# Patient Record
Sex: Female | Born: 1940 | Race: White | Hispanic: No | Marital: Married | Smoking: Former smoker
Health system: Southern US, Community
[De-identification: ages and names within clinical notes are randomized; demographics above are authoritative.]

## PROBLEM LIST (undated history)

## (undated) DIAGNOSIS — E785 Hyperlipidemia, unspecified: Secondary | ICD-10-CM

## (undated) DIAGNOSIS — I10 Essential (primary) hypertension: Secondary | ICD-10-CM

## (undated) DIAGNOSIS — M1990 Unspecified osteoarthritis, unspecified site: Secondary | ICD-10-CM

## (undated) DIAGNOSIS — C801 Malignant (primary) neoplasm, unspecified: Secondary | ICD-10-CM

## (undated) DIAGNOSIS — E119 Type 2 diabetes mellitus without complications: Secondary | ICD-10-CM

## (undated) HISTORY — DX: Type 2 diabetes mellitus without complications: E11.9

## (undated) HISTORY — PX: TOTAL HIP ARTHROPLASTY: SHX124

## (undated) HISTORY — DX: Unspecified osteoarthritis, unspecified site: M19.90

## (undated) HISTORY — DX: Hyperlipidemia, unspecified: E78.5

## (undated) HISTORY — DX: Malignant (primary) neoplasm, unspecified: C80.1

## (undated) HISTORY — DX: Essential (primary) hypertension: I10

---

## 2003-06-12 ENCOUNTER — Other Ambulatory Visit: Payer: Self-pay

## 2004-10-04 ENCOUNTER — Ambulatory Visit: Payer: Self-pay | Admitting: Unknown Physician Specialty

## 2006-09-19 ENCOUNTER — Ambulatory Visit: Payer: Self-pay | Admitting: Unknown Physician Specialty

## 2009-04-02 ENCOUNTER — Ambulatory Visit: Payer: Self-pay | Admitting: Gastroenterology

## 2009-04-07 ENCOUNTER — Ambulatory Visit: Payer: Self-pay | Admitting: Gastroenterology

## 2009-12-23 ENCOUNTER — Ambulatory Visit: Payer: Self-pay | Admitting: Unknown Physician Specialty

## 2011-10-10 DIAGNOSIS — M25559 Pain in unspecified hip: Secondary | ICD-10-CM | POA: Insufficient documentation

## 2011-10-10 DIAGNOSIS — M722 Plantar fascial fibromatosis: Secondary | ICD-10-CM | POA: Insufficient documentation

## 2012-05-01 LAB — COMPREHENSIVE METABOLIC PANEL
Albumin: 4 g/dL (ref 3.4–5.0)
Alkaline Phosphatase: 80 U/L (ref 50–136)
BUN: 10 mg/dL (ref 7–18)
Calcium, Total: 9.3 mg/dL (ref 8.5–10.1)
Chloride: 101 mmol/L (ref 98–107)
Co2: 23 mmol/L (ref 21–32)
Creatinine: 0.74 mg/dL (ref 0.60–1.30)
EGFR (Non-African Amer.): >60
Glucose: 118 mg/dL — ABNORMAL HIGH (ref 65–99)
SGOT(AST): 30 U/L (ref 15–37)
SGPT (ALT): 27 U/L (ref 12–78)
Sodium: 135 mmol/L — ABNORMAL LOW (ref 136–145)

## 2012-05-01 LAB — CBC
HGB: 14.9 g/dL (ref 12.0–16.0)
MCHC: 33.7 g/dL (ref 32.0–36.0)
RBC: 4.72 10*6/uL (ref 3.80–5.20)

## 2012-05-01 LAB — PROTIME-INR
INR: 1
Prothrombin Time: 13.5 secs (ref 11.5–14.7)

## 2012-05-02 ENCOUNTER — Inpatient Hospital Stay: Payer: Self-pay | Admitting: Internal Medicine

## 2012-05-02 LAB — BASIC METABOLIC PANEL
Anion Gap: 9 (ref 7–16)
BUN: 11 mg/dL (ref 7–18)
Calcium, Total: 8.6 mg/dL (ref 8.5–10.1)
Chloride: 99 mmol/L (ref 98–107)
Co2: 27 mmol/L (ref 21–32)
Creatinine: 0.77 mg/dL (ref 0.60–1.30)
EGFR (African American): >60
EGFR (Non-African Amer.): >60
Glucose: 115 mg/dL — ABNORMAL HIGH (ref 65–99)
Osmolality: 270 (ref 275–301)
Potassium: 3.8 mmol/L (ref 3.5–5.1)
Sodium: 135 mmol/L — ABNORMAL LOW (ref 136–145)

## 2012-05-02 LAB — CBC WITH DIFFERENTIAL/PLATELET
Basophil %: 0.8 %
Eosinophil #: 0.3 10*3/uL (ref 0.0–0.7)
Eosinophil %: 3.9 %
HCT: 40.5 % (ref 35.0–47.0)
Lymphocyte %: 20.1 %
MCH: 31.9 pg (ref 26.0–34.0)
MCHC: 34 g/dL (ref 32.0–36.0)
MCV: 94 fL (ref 80–100)
Monocyte %: 8.5 %
Neutrophil #: 5.2 10*3/uL (ref 1.4–6.5)
Neutrophil %: 66.7 %
Platelet: 225 10*3/uL (ref 150–440)
RBC: 4.31 10*6/uL (ref 3.80–5.20)
RDW: 13.3 % (ref 11.5–14.5)
WBC: 7.7 10*3/uL (ref 3.6–11.0)

## 2012-05-02 LAB — LIPID PANEL
HDL Cholesterol: 45 mg/dL (ref 40–60)
Ldl Cholesterol, Calc: 63 mg/dL (ref 0–100)
Triglycerides: 112 mg/dL (ref 0–200)
VLDL Cholesterol, Calc: 22 mg/dL (ref 5–40)

## 2012-05-02 LAB — MAGNESIUM: Magnesium: 2 mg/dL

## 2012-05-02 LAB — CK TOTAL AND CKMB (NOT AT ARMC)
CK, Total: 69 U/L (ref 21–215)
CK-MB: 1.1 ng/mL (ref 0.5–3.6)

## 2012-05-02 LAB — HEMOGLOBIN A1C: Hemoglobin A1C: 6.3 % (ref 4.2–6.3)

## 2012-05-02 LAB — TROPONIN I: Troponin-I: <0.02 ng/mL

## 2012-05-06 LAB — CREATININE, SERUM
Creatinine: 0.85 mg/dL (ref 0.60–1.30)
EGFR (African American): >60

## 2012-05-09 ENCOUNTER — Ambulatory Visit: Payer: Self-pay | Admitting: Gastroenterology

## 2012-05-31 ENCOUNTER — Ambulatory Visit: Payer: Self-pay | Admitting: Gastroenterology

## 2012-06-03 LAB — PATHOLOGY REPORT

## 2013-03-20 DIAGNOSIS — M1610 Unilateral primary osteoarthritis, unspecified hip: Secondary | ICD-10-CM | POA: Insufficient documentation

## 2013-04-28 ENCOUNTER — Ambulatory Visit: Payer: Self-pay | Admitting: Surgery

## 2013-04-28 LAB — CBC WITH DIFFERENTIAL/PLATELET
Basophil #: 0 10*3/uL (ref 0.0–0.1)
Basophil %: 0.6 %
Eosinophil #: 0.1 10*3/uL (ref 0.0–0.7)
Eosinophil %: 1.4 %
HCT: 43.3 % (ref 35.0–47.0)
HGB: 14.9 g/dL (ref 12.0–16.0)
Lymphocyte #: 1.4 10*3/uL (ref 1.0–3.6)
Lymphocyte %: 18.5 %
MCH: 32.2 pg (ref 26.0–34.0)
MCHC: 34.3 g/dL (ref 32.0–36.0)
MCV: 94 fL (ref 80–100)
MONO ABS: 0.8 x10 3/mm (ref 0.2–0.9)
Monocyte %: 10.7 %
NEUTROS PCT: 68.8 %
Neutrophil #: 5.2 10*3/uL (ref 1.4–6.5)
Platelet: 243 10*3/uL (ref 150–440)
RBC: 4.61 10*6/uL (ref 3.80–5.20)
RDW: 13 % (ref 11.5–14.5)
WBC: 7.6 10*3/uL (ref 3.6–11.0)

## 2013-05-05 ENCOUNTER — Ambulatory Visit: Payer: Self-pay | Admitting: Surgery

## 2013-05-19 DIAGNOSIS — IMO0002 Reserved for concepts with insufficient information to code with codable children: Secondary | ICD-10-CM | POA: Insufficient documentation

## 2013-06-16 DIAGNOSIS — Z96649 Presence of unspecified artificial hip joint: Secondary | ICD-10-CM | POA: Insufficient documentation

## 2013-06-30 ENCOUNTER — Ambulatory Visit: Payer: Self-pay | Admitting: Physician Assistant

## 2013-06-30 LAB — CBC WITH DIFFERENTIAL/PLATELET
Basophil #: 0.1 10*3/uL (ref 0.0–0.1)
Basophil %: 0.8 %
EOS ABS: 0.2 10*3/uL (ref 0.0–0.7)
Eosinophil %: 1.1 %
HCT: 42.9 % (ref 35.0–47.0)
HGB: 13.9 g/dL (ref 12.0–16.0)
Lymphocyte #: 1.6 10*3/uL (ref 1.0–3.6)
Lymphocyte %: 9 %
MCH: 30 pg (ref 26.0–34.0)
MCHC: 32.4 g/dL (ref 32.0–36.0)
MCV: 93 fL (ref 80–100)
Monocyte #: 1.1 x10 3/mm — ABNORMAL HIGH (ref 0.2–0.9)
Monocyte %: 6.5 %
Neutrophil #: 14.5 10*3/uL — ABNORMAL HIGH (ref 1.4–6.5)
Neutrophil %: 82.6 %
PLATELETS: 347 10*3/uL (ref 150–440)
RBC: 4.63 10*6/uL (ref 3.80–5.20)
RDW: 13.2 % (ref 11.5–14.5)
WBC: 17.6 10*3/uL — ABNORMAL HIGH (ref 3.6–11.0)

## 2013-06-30 LAB — BASIC METABOLIC PANEL
Anion Gap: 9 (ref 7–16)
BUN: 13 mg/dL (ref 7–18)
CALCIUM: 10.1 mg/dL (ref 8.5–10.1)
CREATININE: 0.86 mg/dL (ref 0.60–1.30)
Chloride: 94 mmol/L — ABNORMAL LOW (ref 98–107)
Co2: 31 mmol/L (ref 21–32)
EGFR (African American): >60
EGFR (Non-African Amer.): >60
Glucose: 111 mg/dL — ABNORMAL HIGH (ref 65–99)
OSMOLALITY: 269 (ref 275–301)
Potassium: 3.5 mmol/L (ref 3.5–5.1)
Sodium: 134 mmol/L — ABNORMAL LOW (ref 136–145)

## 2013-06-30 LAB — URINALYSIS, COMPLETE
Bacteria: NEGATIVE
Bilirubin,UR: NEGATIVE
Blood: NEGATIVE
GLUCOSE, UR: NEGATIVE mg/dL (ref 0–75)
Ketone: NEGATIVE
NITRITE: NEGATIVE
PH: 6 (ref 4.5–8.0)
Specific Gravity: 1.025 (ref 1.003–1.030)

## 2013-07-04 DIAGNOSIS — M7060 Trochanteric bursitis, unspecified hip: Secondary | ICD-10-CM | POA: Insufficient documentation

## 2014-04-30 ENCOUNTER — Ambulatory Visit (INDEPENDENT_AMBULATORY_CARE_PROVIDER_SITE_OTHER): Payer: Medicare Other | Admitting: Podiatry

## 2014-04-30 ENCOUNTER — Encounter: Payer: Self-pay | Admitting: Podiatry

## 2014-04-30 ENCOUNTER — Ambulatory Visit (INDEPENDENT_AMBULATORY_CARE_PROVIDER_SITE_OTHER): Payer: Medicare Other

## 2014-04-30 ENCOUNTER — Other Ambulatory Visit: Payer: Self-pay | Admitting: *Deleted

## 2014-04-30 ENCOUNTER — Encounter: Payer: Self-pay | Admitting: *Deleted

## 2014-04-30 VITALS — Ht 59.0 in | Wt 183.0 lb

## 2014-04-30 DIAGNOSIS — E119 Type 2 diabetes mellitus without complications: Secondary | ICD-10-CM

## 2014-04-30 DIAGNOSIS — L84 Corns and callosities: Secondary | ICD-10-CM

## 2014-04-30 DIAGNOSIS — M2042 Other hammer toe(s) (acquired), left foot: Secondary | ICD-10-CM | POA: Diagnosis not present

## 2014-04-30 DIAGNOSIS — K5730 Diverticulosis of large intestine without perforation or abscess without bleeding: Secondary | ICD-10-CM | POA: Insufficient documentation

## 2014-04-30 DIAGNOSIS — E785 Hyperlipidemia, unspecified: Secondary | ICD-10-CM | POA: Insufficient documentation

## 2014-04-30 DIAGNOSIS — I10 Essential (primary) hypertension: Secondary | ICD-10-CM | POA: Insufficient documentation

## 2014-04-30 DIAGNOSIS — C4491 Basal cell carcinoma of skin, unspecified: Secondary | ICD-10-CM | POA: Insufficient documentation

## 2014-04-30 NOTE — Patient Instructions (Signed)
Apply antibiotic ointment and a band-aid to the left 5th toe until healed.  Monitor for any signs/symptoms of infection. Call the office immediately if any occur or go directly to the emergency room. Call with any questions/concerns.

## 2014-04-30 NOTE — Progress Notes (Signed)
   Subjective:    Patient ID: Mallory Walters, female    DOB: 26-Jun-1940, 74 y.o.   MRN: [id number]  HPI Comments: 74 year old female presents the office they with a painful lesion on the left fifth toe between the fourth and fifth toe. She states this been ongoing for approximately one month and is painful particularly with pressure. She has previously seen her primary care physician for this. She's had no prior treatment. She denies any redness around the area or drainage. She is diabetic and her last HbA1c was 6.9. No other complaints at this time.  Toe Pain       Review of Systems  Endocrine:       Diabetes  Musculoskeletal:       Joint pain   All other systems reviewed and are negative.      Objective:   Physical Exam AAO x3, NAD DP/PT pulses palpable b/l, CRT < 3 sec Protective sensation intact with SWMF, vibratory sensation intact, Achilles tendon reflex intact.  There is a hyperkerotic lesion along the medial aspect of the left fifth digit at the level of the DIPJ. Upon debridement of the lesion, there is a small, pinpoint superficial opening. There is no purulence expressed. There is no surrounding erythema, ascending cellulitis, fluctuance, crepitance, or other clinical signs of infection. There is mild adductovarus of the 5th toe abutting the 4th digit. No other areas of tenderness to bilateral lower extremities. No overlying edema, erythema, increase in warmth bilaterally. MMT 5/5, ROM WNL.  No other open lesions or pre-ulcerative lesions. No pain with calf compression, swelling, warmth, erythema.      Assessment & Plan:  74 year old female with corn left 5th digit due to pressure -X-rays were obtained and reviewed with the patient. -Treatment options discussed including all alternatives, risks, complications -Lesion sharply debrided without complication to reveal underlying small, superficial wound without signs of infection. Antibiotic ointment was applied followed by a  band-aid. Continue this daily until healed. Monitor for any signs/symptoms of infection and directed to call the office immediately if any are to occur.  -Offloading pads also dispensed. -Follow-up as needed. Follow up in 10 days if the area on the left 5th toe is not healed, or if it remains symptomatic. In the meantime, encouraged to call the office with any questions/concerns/change in symptoms.

## 2014-06-26 NOTE — Consult Note (Signed)
PATIENT NAME:  Mallory Walters, Mallory Walters MR#:  [id number] DATE OF BIRTH:  02/13/1941  DATE OF CONSULTATION:  05/02/2012  REFERRING PHYSICIAN:   CONSULTING PHYSICIAN:  Lollie Sails, MD  REASON FOR CONSULTATION:  Intractable nausea, vomiting, chest pain.   HISTORY OF PRESENT ILLNESS:  The patient is a 74 year old Caucasian female who was admitted yesterday after coming to the hospital with chest pain. She states that about 5 days ago she began to have some upper central back pain. Then yesterday, this changed to a retrosternal pain that radiated under her left arm. It did not radiate into the arm or up the neck. Currently, she states it is retrosternal discomfort, 5 out of 10. It seems to increase if she lies flat. It is better if she is at an angle or standing. She does get nauseated however when she stands. She does have a history of having been seen by ENT in the past with a diagnosis of reflux-related pharyngitis. However, she denies any heartburn. She states she occasionally chokes on food, but does not have actual dysphagia. She does not regurgitate food. There is no nausea, vomiting or abdominal pain currently. She states she has a bowel movement about 3 to 4 times a day, no black stools, blood in the stools, or slimy stools. She did have a cholecystectomy many years ago. She does however report a bitter taste in her mouth when she awakens. This could infer bile/alkaline reflux. She has a history of a colonoscopy being done on 04/07/2009 for pelvic and rectal pain showing diverticulosis and internal hemorrhoids. She has never had an upper scope done.   PAST MEDICAL HISTORY:  The patient has a history of hypertension, hyperlipidemia and basal cell cancer.   PAST SURGICAL HISTORY:  Hysterectomy, appendectomy, tonsillectomy, Mohs surgery and cataract surgery as well as a cholecystectomy as noted.   GI FAMILY HISTORY:  Negative for colorectal cancer, liver disease, or ulcers.   SOCIAL HISTORY:  The  patient is a nonsmoker having quit many years ago. She does not use alcohol or any drugs.   REVIEW OF SYSTEMS:  10 systems reviewed per admission history and physical, agree with same.   CURRENT MEDICATIONS:  Aspirin 81 mg once a day, hydrochlorothiazide/triamterene 25/37.5 mg once a day, Metformin 500 mg once a day, simvastatin 20 mg once a day.   ALLERGIES:  CONTRAST DYE, OXYCODONE AND PENICILLIN.   PHYSICAL EXAMINATION:  VITAL SIGNS: Temperature 98.8, pulse 65, respirations 18, blood pressure 99/60, pulse oximetry 94%.  GENERAL: She is a 75 year old Caucasian female in no acute distress.  HEENT: Normocephalic, atraumatic. Eyes: Anicteric. Nose: Septum midline. No lesions. Oropharynx: No lesions.  NECK: Supple. No JVD. No lymphadenopathy. No thyromegaly.  HEART: Regular rate and rhythm.  LUNGS: Clear.  ABDOMEN: Soft. There is some minimal discomfort to palpation in the epigastric region. Bowel sounds positive, normoactive. There is no apparent organomegaly or masses felt.  RECTAL: Anorectal exam is deferred.  EXTREMITIES: No clubbing, cyanosis or edema.  NEUROLOGICAL: Cranial nerves II through XII grossly intact. Muscle strength bilaterally equal and symmetric, 5 out of 5. DTRs bilaterally equal and symmetric.   DIAGNOSTIC DATA:  On admission to the hospital, she had glucose 118, BUN 10, sodium 135, potassium 3.5, chloride 101 and bicarbonate 23. Her hepatic profile was normal. She has had cardiac enzymes x 3 normal. Hemogram on admission showed a white count of 10.0, hemoglobin of 14.9, hematocrit of 42.2, platelet count 276. Repeat laboratories today show a white count of  7.7, H and H 13.8/40.5, platelet count 225, normal indices. Her INR on admission to the hospital was 1.0. She had been on heparin. Her sodium is still 135 this morning. She has had multiple tests to include a CT scan of the chest, abdomen and pelvis without contrast. The chest film showed minimal subsegmental atelectasis at  the lung bases possibly some amount of emphysema, but no focal pneumonia. CT scan of abdomen and pelvis shows the gallbladder to be surgically absent. There is sigmoid diverticulosis. No evidence of diverticulitis. No evidence of bowel obstruction or ileus. No acute hepatobiliary abnormality or urinary tract abnormality. She had a portable chest film that showed no acute cardiopulmonary disorder. She had a Lexiscan showing normal infusion and EKG without evidence of ischemia or arrhythmia. Her LV systolic function showed an EF of 82% and no evidence of myocardial ischemia with normal myocardial perfusion. She had an abdominal ultrasound showing no evidence of DVT in either lower extremity.   ASSESSMENT:  Atypical chest pain. The patient does have a history of cholecystectomy, remote. She does report having a bitter type of taste in her mouth in the morning and this could infer the possibility of bile reflux issues. This could as well as give some atypical chest pain as well as several others in the differential diagnosis to include esophagitis or gastritis/gastric ulcers. It is of note that there is a V/Q scan that has been discussed. I will need to discuss further the possibility of this in light of negative Dopplers of the lower extremities and normal chest scan with the prime doc. I do feel that she will need an upper endoscopy prior to her discharge. We will discuss timing with prime doc in light of above. I have discussed the risks, benefits and complications of EGD to include, but not limited to bleeding, infection, perforation, and the risk of sedation and she wishes to proceed. In the interim, I would continue current medications to include intravenous pantoprazole 40 mg twice a day. We will tentatively place her on the schedule for tomorrow afternoon for EGD.    ____________________________ Lollie Sails, MD mus:si D: 05/02/2012 20:05:50 ET T: 05/02/2012 20:29:18 ET JOB#: [id number]  cc: Lollie Sails, MD, <Dictator> Lollie Sails MD ELECTRONICALLY SIGNED 05/05/2012 10:40

## 2014-06-26 NOTE — Consult Note (Signed)
Brief Consult Note: Diagnosis: Atypical chest pain, negative myoview.   Patient was seen by consultant.   Consult note dictated.   Recommend to proceed with surgery or procedure.   Recommend further assessment or treatment.   Orders entered.   Comments: Please see full Gi consult.  Patient presenting with atypical chest pain, evaluation uninformative at this time.  Question of possible need to do VQ scan.  DDX includes multiple GI etiologies to include bile/alkaline reflux.  Of note patien currently on lovenox and full dose ASA, inferring VQ should be done before considering sedated proceedure, particularly in that biopsies may be contraindicated.   Will tenatively place on endo schedule for egd tomorrow pm, but I will need to discuss above with Prime Doc first.  Continue ppi as you are..  Electronic Signatures: Loistine Simas (MD)  (Signed 27-Feb-14 20:12)  Authored: Brief Consult Note   Last Updated: 27-Feb-14 20:12 by Loistine Simas (MD)

## 2014-06-26 NOTE — Discharge Summary (Signed)
DATE OF BIRTH:  21-May-1940  DATE OF ADMISSION:  05/02/2012  ADMITTING PHYSICIAN:  Dr. Bridgette Habermann  DATE OF DISCHARGE:  05/06/2012  DISCHARGING PHYSICIAN:  Dr. Gladstone Lighter  CONSULTATIONS IN THE HOSPITAL:   1.  Cardiology consultation by Dr. Saralyn Pilar 2.  Gastroenterology consultation by Dr. Gustavo Lah.    PRIMARY CARE PHYSICIAN:  [facility]   DISCHARGE DIAGNOSES: 1.  Chest pain secondary to gastroesophageal reflux disease/bile reflux..  2.  Hypertension.  3.  Diabetes mellitus.   DISCHARGE HOME MEDICATIONS:   1.  Simvastatin 20 mg p.o. daily.  2.  Metformin 500 mg p.o. daily.  3.  Triamterene HCTZ 37.5/25 mg p.o. daily.  4. Sucralfate 1 gram per 10 mL oral suspension, 1 gram oral t.i.d. before meals.  5.  Pantoprazole 40 mg p.o. b.i.d.   DISCHARGE DIET:  Low-sodium diet.   DISCHARGE ACTIVITY:  As tolerated.    FOLLOWUP INSTRUCTIONS: 1.  Patient advised to hold her aspirin starting the day of discharge until she gets her EGD done, which will be this week.  2.  The patient was advised that Dr. Marton Redwood office will call her in the next day to schedule an appointment for her EGD this week. PCP followup in 2 weeks.    LABS AND IMAGING STUDIES:  WBC 7.7, hemoglobin 13.8, hematocrit 40.5, platelet count 225. Sodium 135, potassium 3.8, chloride 99, bicarb 27, BUN 11, creatinine 0.77, glucose of 115. Magnesium 2.0. HgA1c is 6.3. LDL cholesterol 63, HDL 45, triglycerides 112, and total cholesterol 130.   Echocardiogram showing normal LV systolic function, EF is 57% to 55%, normal cardiac valves,  mild mitral regurgitation is present. No evidence of ischemic, primary valvular, hypertrophic or pulmonary heart disease. Myoview showing normal Lexiscan, no evidence of ischemia or arrhythmia, normal LV systolic function, EF calculated to be 82%, and normal myocardial perfusion without evidence of myocardial ischemia.   D-dimer was slightly elevated at 0.58.   Ultrasound Dopplers  of bilateral lower extremities showing no evidence of any DVT.   V/Q scan was normal.    CT of the chest, abdomen and pelvis done without contrast showing sigmoid diverticulosis. No evidence of acute diverticulitis. No bowel obstruction, ileus or acute hepatobiliary or urinary tract abnormality. In the chest, there was no pleural/pericardial effusion. Minimal subsegmental atelectasis in the lung bases. No focal pneumonia seen.  BRIEF HOSPITAL COURSE: The patient is a very pleasant, 74 year old Caucasian female, with past medical history significant for hypertension and diabetes, comes to the hospital secondary to chest pain.   1.  Chest pain. Initially admitted for possible angina. She was monitored on telemetry. Cardiac enzymes remained negative. She did have a Lexiscan done, which was normal, and echo did not show any acute wall motion abnormalities. Her chest pain was more pleuritic in nature, midsternal, radiating to her back, and especially worsened when she lay flat in bed. It sounded more like reflux symptoms rather than pulmonary she did have a D-dimer done, which was slightly elevated. The patient had anaphylaxis to IV contrast, so she did have a plain CT scan, which did not show any pulmonary parenchymal abnormality, and V/Q scan done was negative for any PE. GI was consulted. She was started on Protonix and also Sucralfate.  Once V/Q was cleared, patient was supposed to get an EGD prior to discharge, but her symptoms have improved a lot, and since she was on full dose aspirin in the hospital, after discussing with the patient and Dr. Gustavo Lah, patient elected to  have the EGD as an outpatient. She is being discharged on a proton pump inhibitor and also Carafate at this time, and Dr. Marton Redwood office will call the patient to schedule for her endoscopy.   2.  Her home medications for hypertension and diabetes were continued without any changes. Her course has been otherwise uneventful in the  hospital.   DISCHARGE CONDITION: Stable.   DISCHARGE DISPOSITION: Home.   Time spent on discharge is 45 minutes.     ____________________________ Gladstone Lighter, MD rk:mr D: 05/07/2012 17:51:56 ET T: 05/07/2012 20:57:57 ET JOB#: [id number]  cc: Gladstone Lighter, MD, <Dictator> Lollie Sails, MD [place] MD ELECTRONICALLY SIGNED 05/08/2012 13:33

## 2014-06-26 NOTE — Consult Note (Signed)
Chief Complaint:  Subjective/Chief Complaint patient seen for atypical chest pain.  feeling some better today, had been eating some regular diet without problems. no emesis or nausea, no abdominal pain, still some retrosternal discomfort, 2-5/10.   VITAL SIGNS/ANCILLARY NOTES: **Vital Signs.:   28-Feb-14 16:20  Vital Signs Type Routine  Temperature Temperature (F) 98.6  Celsius 37  Temperature Source oral  Pulse Pulse 53  Respirations Respirations 18  Systolic BP Systolic BP 076  Diastolic BP (mmHg) Diastolic BP (mmHg) 71  Mean BP 84  Pulse Ox % Pulse Ox % 96  Pulse Ox Activity Level  At rest  Oxygen Delivery Room Air/ 21 %   Brief Assessment:  Cardiac Regular   Respiratory clear BS   Gastrointestinal details normal Soft  Nontender  Nondistended  No masses palpable  Bowel sounds normal   Assessment/Plan:  Assessment/Plan:  Assessment 1) atypical chest pain-stable/improving.  awaiting VQ scan before proceeding with EGD.   Plan 1) continue ppi and carafate. will arrange for egd monday, awaiting VQ scan tomorrow.  discussed with Dr Tressia Miners.   Electronic Signatures: Loistine Simas (MD)  (Signed [phone] 20:59)  Authored: Chief Complaint, VITAL SIGNS/ANCILLARY NOTES, Brief Assessment, Assessment/Plan   Last Updated: 28-Feb-14 20:59 by Loistine Simas (MD)

## 2014-06-26 NOTE — H&P (Signed)
PATIENT NAME:  Mallory Walters, Mallory Walters MR#:  [id number] DATE OF BIRTH:  August 29, 1940  DATE OF ADMISSION:  05/01/2012  REFERRING PHYSICIAN: [facility], referring physician Dr. Corky Downs.    CHIEF COMPLAINT:  Chest pain.    HISTORY OF PRESENT ILLNESS:  The patient is a pleasant 74 year old Caucasian female with diabetes, hypertension, hyperlipidemia who presents here with chest pain. She states that she had acute onset chest pain starting about 7:30 this morning. It was constant with radiation to her upper chest and under the left arm. The pain initially was 6 to 7 out of 10. She went to see her PCP. An EKG was done that showed no significant ST changes but was changed from previous EKGs in the sense that it had some T wave inversions in V2. It lasted about 30 to 45 minutes. She was given aspirin and was referred here. She was given nitroglycerin en route. The nitroglycerin appears to be helping the chest pain. Currently, the chest pain is better and not as intense but persistent. She has got a negative troponin. Hospitalist services were contacted for further evaluation and management. The patient also, of note, complains of a mild cough. She, of note, also had back pain in the left lateral side about 4 or 5 days ago.   PAST MEDICAL HISTORY: Hypertension, diabetes, history of diverticulosis, hyperlipidemia, basal cell cancer.   PAST SURGICAL HISTORY: Cholecystectomy, hysterectomy, appendectomy, tonsillectomy, MOHS surgery and cataract extraction.    ALLERGIES: CODEINE CAUSING GI UPSET, PENICILLIN G CAUSING HIVES AND IODINATED CONTRAST, I.E.  IV DYE CAUSING SHORTNESS OF BREATH, POSSIBLY ANAPHYLACTIC.   FAMILY HISTORY: Father with hypertension and CAD. Mother with hypertension and osteoporosis.   SOCIAL HISTORY: She is married. Former smoker, quit in 1970s. No alcohol or drug use.   OUTPATIENT MEDICATIONS: Aspirin 81 mg daily, hydrochlorothiazide/triamterene 25/37.5 mg 1 tab daily, metformin 500 mg in the  morning and simvastatin 20 mg daily.   REVIEW OF SYSTEMS:  CONSTITUTIONAL: No fever, fatigue or weakness. No weight changes.  EYES: Denies blurry vision, double vision or headaches.  ENT: No tinnitus, snoring, postnasal drip.  RESPIRATORY: Positive for shortness of breath associated with chest pain. Occasional cough, no hemoptysis, apnea or COPD  CARDIOVASCULAR: Chest pain as above. Some orthopnea today. No swelling in the legs. Has high blood pressure. No history of syncope or MI.  GASTROINTESTINAL: No nausea, vomiting, diarrhea, abdominal pain, melena or ulcers.  GENITOURINARY:  Denies dysuria or hematuria.  HEME/LYMPH:  Denies anemia or easy bruising.  SKIN: Denies any rashes.  MUSCULOSKELETAL: Left posterior pain in the back for several days.  NEUROLOGIC: Denies focal weakness, numbness or strokes.  PSYCH: Denies anxiety or insomnia.   PHYSICAL EXAMINATION: VITAL SIGNS: Temperature on arrival 96.8, pulse 88, respiratory rate 20, blood pressure 166/81, O2 sat 95% on room air.  GENERAL: The patient is an obese an elderly female laying in bed in no obvious distress.  HEENT: Normocephalic, atraumatic. Pupils are equal and reactive. Anicteric sclerae. Extraocular muscles intact. Moist mucous membranes.    NECK: Supple. No thyroid tenderness. No JVD. No cervical lymphadenopathy.  CARDIOVASCULAR: S1, S2, regular rate and rhythm. No significant murmurs appreciated.  LUNGS: Clear to auscultation without wheezing, rhonchi or rales.  ABDOMEN: Soft, nontender, nondistended. Positive bowel sounds in all quadrants.  EXTREMITIES: No significant lower extremity edema. No significant point tenderness to  auscultation except to palpation of the back.  NEUROLOGIC: Cranial nerves II through XII grossly intact. Strength is 5 out of 5 in  all extremities. Sensation is intact to light touch.  PSYCH: Awake, alert, oriented x 3. Cooperative.   LABORATORY DATA: Sodium 135, potassium 3.5, BUN 10, creatinine  0.74, glucose 118. LFTs within normal limits. Troponin negative. WBC 10, hemoglobin 14.9 and platelets 276. D-dimer 0.34. INR 1.   X-RAY OF THE CHEST: No acute cardiopulmonary disease. No pneumonia or infiltrates.   EKG: Normal sinus rhythm lobe. Low voltage QRS. There are no acute ST elevations or depressions. There are nonspecific ST abnormalities.   ASSESSMENT AND PLAN: We have a pleasant 74 year old Caucasian female with hypertension, hyperlipidemia and diabetes, who presents with chest pain with atypical features concerning for unstable angina. She has had no pains like this prior. There is a negative troponin and no significant ST elevations or depressions on the EKG at this point, but given patient with multiple risk factors we will admit the patient for observation and rule out acute coronary syndrome. The patient has been given a aspirin already. Will start it from tomorrow, continue the statin and add a beta blocker. I would trend troponins and CK-MB and obtain an echocardiogram, and admit the patient on telemetry bed. Would also order a stress test if the patient is ruled out for acute coronary syndrome. The patient does have allergy to dye, which might be anaphylactic type as she was told by physicians at [facility] where dye was administered several years ago, did not ever undergo IV dye administration. Would obtain a cardiology consult and start the patient on a heparin drip in addition to nitroglycerin sublingual and a patch. The patient has a negative d-dimer and negative x-ray of the chest. Blood pressure and heart rates are stable. I would hold diuretic at this point, resume the statin. Would hold metformin and start a sliding scale insulin and check a hemoglobin A1c.    CODE STATUS: Full code.   TOTAL TIME SPENT: 50 minutes.    ____________________________ Vivien Presto, MD sa:cs D: 05/01/2012 14:11:00 ET T: 05/01/2012 15:19:51 ET JOB#: [id number]  cc: Vivien Presto, MD,  <Dictator> Kerin Perna, MD Vivien Presto MD ELECTRONICALLY SIGNED 05/13/2012 13:19

## 2014-06-26 NOTE — Consult Note (Signed)
Chief Complaint:  Subjective/Chief Complaint seen for atypical chest pain.  This is improving, still present, 4/10 worst.  tolerting po regular diet. no emesis, no nausea.  c/o constipation.   VITAL SIGNS/ANCILLARY NOTES: **Vital Signs.:   01-Mar-14 08:46  Vital Signs Type Routine  Temperature Temperature (F) 98.7  Celsius 37  Pulse Pulse 59  Respirations Respirations 16  Systolic BP Systolic BP 951  Diastolic BP (mmHg) Diastolic BP (mmHg) 79  Mean BP 96  Pulse Ox % Pulse Ox % 97  Pulse Ox Activity Level  At rest  Oxygen Delivery Room Air/ 21 %   Brief Assessment:  Cardiac Regular   Respiratory clear BS   Gastrointestinal details normal Soft  Nontender  Nondistended  No masses palpable  Bowel sounds normal   Radiology Results: [place]:    27-Feb-14 15:19, [place] Color Flow Doppler Low Extrem Bilat (Legs)  [place] Color Flow Doppler Low Extrem Bilat (Legs)   REASON FOR EXAM:    elevated ddimer  COMMENTS:       PROCEDURE: [place]  - US DOPPLER LOW EXTR BILATERAL  - May 02 2012  3:19PM     RESULT: Comparison: None    Findings: Multiple longitudinal and transverse gray-scale as well as   color and spectral Doppler images of  bilateral lower extremity veins   were obtained from the common femoral veins through the popliteal veins.    The right common femoral, greater saphenous, femoral, popliteal veins,   and venous trifurcation are patent, demonstrating normal color-flow and   compressibility. No intraluminal thrombus is identified.There is normal   respiratory variation and augmentation demonstrated at all vein levels.   The left common femoral, greater saphenous, femoral, popliteal veins, and   venous trifurcation are patent, demonstrating normal color-flow and   compressibility. No intraluminal thrombus is identified.There is normal   respiratory variation and augmentation demonstrated at all vein levels.    IMPRESSION:      1. No evidence of DVT in the right lower extremity.  2. No  evidence of DVT in the left lower extremity.    Dictation Site: 1        Verified By: Jennette Banker, M.D., MD  CT:    27-Feb-14 15:27, CT Chest Abdomen and Pelvis WO  CT Chest Abdomen and Pelvis WO   REASON FOR EXAM:    (1) chest pain, dyspnea; (2) nausea, vomiting  COMMENTS:       PROCEDURE: CT  - CT CHEST ABDOMEN AND PELVIS WO  - May 02 2012  3:27PM     RESULT: Axial noncontrast CT scanning was performed through the chest,   abdomen, and pelvis. The patient is reporting chest discomfort and   nausea. Review of multiplanar reconstructed images was performed   separately on the VIA monitor.    CT scan of the chest: At lung window settings there is no alveolar   infiltrate. Minimal subsegmental atelectasis is present in the lower   lobes. No abnormal nodules are demonstrated. At mediastinal window   settings the cardiac chambers are mildly enlarged. The caliber of the   thoracic aorta is normal. There are no pathologic sized mediastinal or    hilar lymph nodes. There is no pleural nor pericardial effusion. There is   no axillary lymphadenopathy. The sternum and thoracic vertebral bodies   are normal in appearance. No lytic or blastic rib lesion is demonstrated.    Conclusion: There isminimal subsegmental atelectasis at the lung bases.   There may  be an element of emphysema present. There is no focal pneumonia.    CT scan of the abdomen and pelvis: The gallbladder is surgically absent.   The liver exhibits no focal mass or ductal dilation. The spleen is normal   in density and size. The pancreas exhibits no focal mass or inflammatory   change. The stomach is partially distended with fluid and gas. There are   no adrenal masses. The kidneys exhibit no evidence of stones or   obstruction or perinephric inflammatory change. The caliber of the   abdominal aorta is normal.  The unopacified loops of small and large bowel exhibit no evidence of   ileus nor obstruction. There is  sigmoid diverticulosis without evidence   of acute diverticulitis. The partially distended urinary bladder is   normal in appearance. The uterus is surgically absent. There are no   adnexal masses. There is no free pelvic fluid. The psoas musculature is   normal in density and symmetric in size. There is no inguinal nor   significant umbilical hernia.    The lumbar vertebral bodies are preserved in height. The bony pelvis   exhibits no acute abnormalities.    IMPRESSION:   1. Please see the discussion above regarding findings in the thorax.  2. There is sigmoid diverticulosis but there is no evidence of acute   diverticulitis. There is no evidence of bowel obstruction or ileus.  3. There is no evidence of acute hepatobiliary abnormality nor acute   urinary tract abnormality.     Dictation Site:1        Verified By: DAVID A. Martinique, M.D., MD  Nuclear Med:    27-Feb-14 10:16, NM MYOCARDIAL SCAN  NM MYOCARDIAL SCAN   PRELIMINARY REPORT    The following is a PRELIMINARY Radiology report.  A final report will follow pending radiologist verification.      REASON FOR EXAM:    chest pain  COMMENTS:       PROCEDURE: NM  - NM MYOCARDIAL SCAN  - May 02 2012 10:16AM     RESULT:     This is a 74 year old female with chest pain. The patient underwent a   Lexiscan infusion EKG with a peak heart rate of 96 beats per minute. Peak   systolic blood pressure of 120/54. The patient had no symptoms. The   patient received 0.4 mg of Lexiscan. Baseline EKG shows normal sinus   rhythm, normal EKG. Peak stress shows no electrocardiographic changes or   arrhythmia. The patient received 13.79 mCi of Sestamibi at rest and 32.48   mCi of Sestamibi peak stress intravenously in the right antecubital.  IMAGE ANALYSIS: Normal LV systolic function with ejection fraction of   82%. Rest and peak stress myocardial perfusion images were compared   showing normal myocardial perfusion at rest and peak stress  without   evidence of myocardial ischemia.     IMPRESSION:     1.Normal Lexiscan infusion EKG without evidence of ischemia or   arrhythmia.     2.Normal LV systolic function with ejection fraction of 82%.     3.Normal myocardial perfusion without evidence of myocardial ischemia.       Dictated By: Corey Skains  ([facility]), M.D., MD    01-Mar-14 10:45, Lung VQ Scan - Nuc Med  Lung VQ Scan - Nuc Med   REASON FOR EXAM:    chest pain, elevated ddimer  COMMENTS:       PROCEDURE: NM  - NM  VQ LUNG SCAN  - May 04 2012 10:45AM     RESULT: Procedure: Ventilation/perfusion lung scan.    Indication: Chest pain    Comparison: None    Radiopharmaceutical: 39.008 mCi Tc-49m DTPA inhaled gas via a nebulizer   for ventilation; 3.872 mCi Tc-56m MAA administered intravenously for   perfusion.  Technique: Standard dynamic anterior and posterior images were obtained   for the ventilation study; anterior and posterior images in varying   degrees of obliquity were obtained for the perfusion study; the   ventilation and perfusion studies were compared with a recent chest x-ray.    Findings:     PA and lateral Chest X-ray from 05/04/2012 : No acute disease of the chest    Ventilation: Normal ventilation.    Perfusion: There are no perfusion defects.    IMPRESSION:   Normal V/Q scan.    Dictation Site: 1        Verified By: Jennette Banker, M.D., MD   Assessment/Plan:  Assessment/Plan:  Assessment 1) atypical chest pain, negative lexiscan, neg VQ this am.  Impression possibly GERD related, possibly alkaline reflux.   Plan 1) continue current ppi and carafate.  EGD for monday.   Electronic Signatures: Loistine Simas (MD)  (Signed 01-Mar-14 12:28)  Authored: Chief Complaint, VITAL SIGNS/ANCILLARY NOTES, Brief Assessment, Radiology Results, Assessment/Plan   Last Updated: 01-Mar-14 12:28 by Loistine Simas (MD)

## 2014-06-26 NOTE — Consult Note (Signed)
PATIENT NAME:  Mallory Walters, Mallory Walters MR#:  [id number] DATE OF BIRTH:  [birth year]  DATE OF CONSULTATION:  05/01/2012  PRIMARY CARE PHYSICIAN:  Dr. Hoy Morn.   CONSULTING PHYSICIAN:  Isaias Cowman, MD  CHIEF COMPLAINT: Chest pain.   REASON FOR CONSULTATION: Consultation requested for evaluation of chest pain.   HISTORY OF PRESENT ILLNESS: The patient is a 74 year old female referred for evaluation of chest pain. The patient reports she was in her usual state of health until approximately 7:30 this morning. The patient developed chest discomfort with associated shortness of breath. The patient was seen by Dr. Hoy Morn, her primary care physician, and was referred to [facility]. EKG was nondiagnostic. Admission labs were notable for a negative troponin.  PAST MEDICAL HISTORY: 1.  Hypertension.  2.  Hyperlipidemia.  3.  Diabetes.  4.  History of diverticulosis.   MEDICATIONS: Aspirin 81 mg daily, HCTZ/ triamterene 25/37.5 daily, metformin 500 mg daily, simvastatin 20 mg daily.   SOCIAL HISTORY: The patient is married, lives with her husband. She quit tobacco abuse in 1970s.   FAMILY HISTORY: No immediate family history for myocardial infarction or chest pain.   REVIEW OF SYSTEMS:   CONSTITUTIONAL: No fever or chills.  EYES: No blurry vision.  EARS: No hearing loss.  RESPIRATORY: Shortness of breath as described above.  CARDIOVASCULAR: The patient does have chest pain described as sharp as described above.  GASTROINTESTINAL: No nausea, vomiting, diarrhea or constipation.   GENITORURINARY: No dysuria or hematuria.  ENDOCRINE: No polyuria or polydipsia.  MUSCULOSKELETAL: No arthralgias or myalgias.  NEUROLOGICAL: No focal muscle weakness, numbness.  PSYCHOLOGICAL: No depression or anxiety.   PHYSICAL EXAMINATION: VITAL SIGNS: Blood pressure 125/77, pulse 71, respirations 18, temperature 97.9, pulse oximetry 98%.  HEENT: Pupils equal and reactive to light and accommodation.   NECK: Supple without thyromegaly.  LUNGS: Clear.  HEART: Normal JVP. Normal PMI. Regular rate and rhythm. Normal S1, S2. No appreciable gallop, murmur, or rub.  ABDOMEN: Soft and nontender. Pulses were intact bilaterally.  MUSCULOSKELETAL: Normal muscle tone.  NEUROLOGIC: The patient is alert and oriented x 3. Motor and sensory both grossly intact.   IMPRESSION: This is a 74 year old female with multiple cardiovascular risk factors with new onset chest pain with associated shortness of breath. There is ruling out for myocardial infarction by CPK isoenzymes and troponin. EKG is nondiagnostic.   RECOMMENDATIONS: 1.  Agree with overall current therapy.  2.  Agree with proceeding with functional study in the a.m.  3.  Review 2-D echocardiogram.  4.  Further recommendations pending Lexiscan, sestamibi and 2-D echocardiogram results.     ____________________________ Isaias Cowman, MD ap:cc D: 05/01/2012 16:38:14 ET T: 05/01/2012 17:23:24 ET JOB#: [id number]  cc: Isaias Cowman, MD, <Dictator> Isaias Cowman MD ELECTRONICALLY SIGNED 06/05/2012 12:24

## 2014-06-26 NOTE — Consult Note (Signed)
Chief Complaint:  Subjective/Chief Complaint seen for atypical chest pain.  patient tolerating po regular diet, pain still present but only 2/10.  no vomiting, or nausea.   VITAL SIGNS/ANCILLARY NOTES: **Vital Signs.:   02-Mar-14 07:58  Vital Signs Type Routine  Temperature Temperature (F) 98.4  Celsius 36.8  Temperature Source oral  Pulse Pulse 59  Respirations Respirations 20  Systolic BP Systolic BP 347  Diastolic BP (mmHg) Diastolic BP (mmHg) 84  Mean BP 104  Pulse Ox % Pulse Ox % 95  Pulse Ox Activity Level  At rest  Oxygen Delivery Room Air/ 21 %   Brief Assessment:  Cardiac Regular   Respiratory clear BS   Gastrointestinal details normal Soft  Nontender  Nondistended  No masses palpable  Bowel sounds normal   Radiology Results: XRay:    01-Mar-14 09:37, Chest PA and Lateral  Chest PA and Lateral   REASON FOR EXAM:    for vq scan clearance  COMMENTS:       PROCEDURE: DXR - DXR CHEST PA (OR AP) AND LATERAL  - May 04 2012  9:37AM     RESULT: Comparison: 05/01/2012    Findings:     PA and lateral chest radiographs are provided.  There is no focal   parenchymal opacity, pleural effusion, or pneumothorax. The heart and   mediastinum are unremarkable.  The osseous structures are unremarkable.    IMPRESSION:   No acute disease of the chest.    Dictation Site: 1        Verified By: Jennette Banker, M.D., MD  CT:    27-Feb-14 15:27, CT Chest Abdomen and Pelvis WO  CT Chest Abdomen and Pelvis WO   REASON FOR EXAM:    (1) chest pain, dyspnea; (2) nausea, vomiting  COMMENTS:       PROCEDURE: CT  - CT CHEST ABDOMEN AND PELVIS WO  - May 02 2012  3:27PM     RESULT: Axial noncontrast CT scanning was performed through the chest,   abdomen, and pelvis. The patient is reporting chest discomfort and   nausea. Review of multiplanar reconstructed images was performed   separately on the VIA monitor.    CT scan of the chest: At lung window settings there is no alveolar    infiltrate. Minimal subsegmental atelectasis is present in the lower   lobes. No abnormal nodules are demonstrated. At mediastinal window   settings the cardiac chambers are mildly enlarged. The caliber of the   thoracic aorta is normal. There are no pathologic sized mediastinal or    hilar lymph nodes. There is no pleural nor pericardial effusion. There is   no axillary lymphadenopathy. The sternum and thoracic vertebral bodies   are normal in appearance. No lytic or blastic rib lesion is demonstrated.    Conclusion: There isminimal subsegmental atelectasis at the lung bases.   There may be an element of emphysema present. There is no focal pneumonia.    CT scan of the abdomen and pelvis: The gallbladder is surgically absent.   The liver exhibits no focal mass or ductal dilation. The spleen is normal   in density and size. The pancreas exhibits no focal mass or inflammatory   change. The stomach is partially distended with fluid and gas. There are   no adrenal masses. The kidneys exhibit no evidence of stones or   obstruction or perinephric inflammatory change. The caliber of the   abdominal aorta is normal.  The unopacified loops of  small and large bowel exhibit no evidence of   ileus nor obstruction. There is sigmoid diverticulosis without evidence   of acute diverticulitis. The partially distended urinary bladder is   normal in appearance. The uterus is surgically absent. There are no   adnexal masses. There is no free pelvic fluid. The psoas musculature is   normal in density and symmetric in size. There is no inguinal nor   significant umbilical hernia.    The lumbar vertebral bodies are preserved in height. The bony pelvis   exhibits no acute abnormalities.    IMPRESSION:   1. Please see the discussion above regarding findings in the thorax.  2. There is sigmoid diverticulosis but there is no evidence of acute   diverticulitis. There is no evidence of bowel obstruction or  ileus.  3. There is no evidence of acute hepatobiliary abnormality nor acute   urinary tract abnormality.     Dictation Site:1        Verified By: DAVID A. Martinique, M.D., MD  Nuclear Med:    27-Feb-14 10:16, NM MYOCARDIAL SCAN  NM MYOCARDIAL SCAN   PRELIMINARY REPORT    The following is a PRELIMINARY Radiology report.  A final report will follow pending radiologist verification.      REASON FOR EXAM:    chest pain  COMMENTS:       PROCEDURE: NM  - NM MYOCARDIAL SCAN  - May 02 2012 10:16AM     RESULT:     This is a 74 year old female with chest pain. The patient underwent a   Lexiscan infusion EKG with a peak heart rate of 96 beats per minute. Peak   systolic blood pressure of 120/54. The patient had no symptoms. The   patient received 0.4 mg of Lexiscan. Baseline EKG shows normal sinus   rhythm, normal EKG. Peak stress shows no electrocardiographic changes or   arrhythmia. The patient received 13.79 mCi of Sestamibi at rest and 32.48   mCi of Sestamibi peak stress intravenously in the right antecubital.  IMAGE ANALYSIS: Normal LV systolic function with ejection fraction of   82%. Rest and peak stress myocardial perfusion images were compared   showing normal myocardial perfusion at rest and peak stress without   evidence of myocardial ischemia.     IMPRESSION:     1.Normal Lexiscan infusion EKG without evidence of ischemia or   arrhythmia.     2.Normal LV systolic function with ejection fraction of 82%.     3.Normal myocardial perfusion without evidence of myocardial ischemia.       Dictated By: Corey Skains  ([facility]), M.D., MD    01-Mar-14 10:45, Lung VQ Scan - Nuc Med  Lung VQ Scan - Nuc Med   REASON FOR EXAM:    chest pain, elevated ddimer  COMMENTS:       PROCEDURE: NM  - NM VQ LUNG SCAN  - May 04 2012 10:45AM     RESULT: Procedure: Ventilation/perfusion lung scan.    Indication: Chest pain    Comparison: None    Radiopharmaceutical: 39.008 mCi  Tc-65m DTPA inhaled gas via a nebulizer   for ventilation; 3.872 mCi Tc-69m MAA administered intravenously for   perfusion.  Technique: Standard dynamic anterior and posterior images were obtained   for the ventilation study; anterior and posterior images in varying   degrees of obliquity were obtained for the perfusion study; the   ventilation and perfusion studies were compared with a recent chest x-ray.  Findings:     PA and lateral Chest X-ray from 05/04/2012 : No acute disease of the chest    Ventilation: Normal ventilation.    Perfusion: There are no perfusion defects.    IMPRESSION:   Normal V/Q scan.    Dictation Site: 1        Verified By: Jennette Banker, M.D., MD   Assessment/Plan:  Assessment/Plan:  Assessment 1) atypical chest pain.  possible pudz, bile gastritis. evaluation negative otherwise   Plan 1) egd tomorrow.  I have discussed the risks benefits and complications of egd to include not limited to bleeding infection perforation and sedation and she wishes to proceed.   Electronic Signatures: Loistine Simas (MD)  (Signed 02-Mar-14 14:02)  Authored: Chief Complaint, VITAL SIGNS/ANCILLARY NOTES, Brief Assessment, Radiology Results, Assessment/Plan   Last Updated: 02-Mar-14 14:02 by Loistine Simas (MD)

## 2014-06-26 NOTE — Consult Note (Signed)
Brief Consult Note: Diagnosis: CP, SOB, neg troponin.   Patient was seen by consultant.   Consult note dictated.   Comments: REC  Agree with current therapy, lexi-sest in am, review echo.  Electronic Signatures: Isaias Cowman (MD)  (Signed 26-Feb-14 16:39)  Authored: Brief Consult Note   Last Updated: 26-Feb-14 16:39 by Isaias Cowman (MD)

## 2014-06-27 NOTE — Op Note (Signed)
PATIENT NAME:  Mallory Walters, Mallory Walters MR#:  [id number] DATE OF BIRTH:  06/27/1940  DATE OF PROCEDURE:  05/05/2013  PREOPERATIVE DIAGNOSIS: Anal fissure.  POSTOPERATIVE DIAGNOSIS: Anal fissure.  OPERATION: Rectal exam under anesthesia, lateral anal sphincterotomy.   SURGEON: Rodena Goldmann III, MD  ANESTHESIA: General.   OPERATIVE PROCEDURE: With the patient in the supine position, after the induction of appropriate general anesthesia, the patient was placed in lithotomy position, appropriately padded and positioned. The perineal area was prepped with Betadine and draped with sterile towels. Digital examination revealed some induration in the 2 o'clock position in lithotomy. Multiple internal hemorrhoids were identified. No significant bleeding was encountered. With bivalve retractor, the rectum was visualized, and there was a large, several centimeter fissure at the 2 o'clock position in lithotomy. The area was debrided. There did not appear to be any other significant rectal abnormality other than the noted internal hemorrhoids. The bivalve retractor was inserted and turned laterally. The internal sphincter was identified. An 11 blade was inserted perpendicularly, turned 90 degrees and withdrawn. The muscle fibers were then broken up with an index finger. The rectum was then dilated to 2 to 3 fingers without problem. The site of the sphincterotomy was bleeding significantly, so a 3-0 Vicryl suture was placed to help control bleeding. Gelfoam and Avitene were inserted into the rectum. A sterile dressing was applied. The patient was returned to the recovery room, having tolerated the procedure well. Sponge, instrument and needle counts were correct x2 in the operating room.   ____________________________ Rodena Goldmann III, MD rle:lb D: 05/05/2013 12:12:01 ET T: 05/06/2013 07:21:08 ET JOB#: [id number]  cc: Rodena Goldmann III, MD, <Dictator> Kerin Perna, MD Rodena Goldmann MD ELECTRONICALLY SIGNED 05/07/2013  7:39

## 2016-10-14 ENCOUNTER — Emergency Department: Payer: Medicare Other

## 2016-10-14 ENCOUNTER — Inpatient Hospital Stay
Admission: EM | Admit: 2016-10-14 | Discharge: 2016-10-16 | DRG: 690 | Disposition: A | Discharge status: Home / Self Care | Payer: Medicare Other | Attending: Internal Medicine | Admitting: Internal Medicine

## 2016-10-14 DIAGNOSIS — Z91041 Radiographic dye allergy status: Secondary | ICD-10-CM

## 2016-10-14 DIAGNOSIS — Z7982 Long term (current) use of aspirin: Secondary | ICD-10-CM

## 2016-10-14 DIAGNOSIS — Z888 Allergy status to other drugs, medicaments and biological substances status: Secondary | ICD-10-CM

## 2016-10-14 DIAGNOSIS — E785 Hyperlipidemia, unspecified: Secondary | ICD-10-CM | POA: Diagnosis present

## 2016-10-14 DIAGNOSIS — I6529 Occlusion and stenosis of unspecified carotid artery: Secondary | ICD-10-CM | POA: Diagnosis present

## 2016-10-14 DIAGNOSIS — Z87891 Personal history of nicotine dependence: Secondary | ICD-10-CM | POA: Diagnosis not present

## 2016-10-14 DIAGNOSIS — Z885 Allergy status to narcotic agent status: Secondary | ICD-10-CM | POA: Diagnosis not present

## 2016-10-14 DIAGNOSIS — E119 Type 2 diabetes mellitus without complications: Secondary | ICD-10-CM | POA: Diagnosis present

## 2016-10-14 DIAGNOSIS — Z880 Allergy status to penicillin: Secondary | ICD-10-CM | POA: Diagnosis not present

## 2016-10-14 DIAGNOSIS — Z7984 Long term (current) use of oral hypoglycemic drugs: Secondary | ICD-10-CM | POA: Diagnosis not present

## 2016-10-14 DIAGNOSIS — R55 Syncope and collapse: Secondary | ICD-10-CM | POA: Diagnosis present

## 2016-10-14 DIAGNOSIS — Z859 Personal history of malignant neoplasm, unspecified: Secondary | ICD-10-CM

## 2016-10-14 DIAGNOSIS — N390 Urinary tract infection, site not specified: Secondary | ICD-10-CM | POA: Diagnosis present

## 2016-10-14 DIAGNOSIS — Z79899 Other long term (current) drug therapy: Secondary | ICD-10-CM | POA: Diagnosis not present

## 2016-10-14 DIAGNOSIS — M1990 Unspecified osteoarthritis, unspecified site: Secondary | ICD-10-CM | POA: Diagnosis present

## 2016-10-14 DIAGNOSIS — I214 Non-ST elevation (NSTEMI) myocardial infarction: Secondary | ICD-10-CM

## 2016-10-14 DIAGNOSIS — I10 Essential (primary) hypertension: Secondary | ICD-10-CM

## 2016-10-14 LAB — BASIC METABOLIC PANEL
ANION GAP: 10 (ref 5–15)
BUN: 12 mg/dL (ref 6–20)
CHLORIDE: 104 mmol/L (ref 101–111)
CO2: 25 mmol/L (ref 22–32)
Calcium: 8.7 mg/dL — ABNORMAL LOW (ref 8.9–10.3)
Creatinine, Ser: 0.91 mg/dL (ref 0.44–1.00)
GFR calc non Af Amer: >60 mL/min (ref >60)
Glucose, Bld: 98 mg/dL (ref 65–99)
POTASSIUM: 3.4 mmol/L — AB (ref 3.5–5.1)
Sodium: 139 mmol/L (ref 135–145)

## 2016-10-14 LAB — CBC
HCT: 47.6 % — ABNORMAL HIGH (ref 35.0–47.0)
Hemoglobin: 16.1 g/dL — ABNORMAL HIGH (ref 12.0–16.0)
MCH: 31.2 pg (ref 26.0–34.0)
MCHC: 33.9 g/dL (ref 32.0–36.0)
MCV: 92.1 fL (ref 80.0–100.0)
PLATELETS: 407 10*3/uL (ref 150–440)
RBC: 5.17 MIL/uL (ref 3.80–5.20)
RDW: 13.4 % (ref 11.5–14.5)
WBC: 7.4 10*3/uL (ref 3.6–11.0)

## 2016-10-14 LAB — URINALYSIS, COMPLETE (UACMP) WITH MICROSCOPIC
BACTERIA UA: NONE SEEN
Bilirubin Urine: NEGATIVE
Glucose, UA: NEGATIVE mg/dL
Hgb urine dipstick: NEGATIVE
Ketones, ur: NEGATIVE mg/dL
Nitrite: NEGATIVE
SPECIFIC GRAVITY, URINE: 1.02 (ref 1.005–1.030)
pH: 7.5 (ref 5.0–8.0)

## 2016-10-14 LAB — GLUCOSE, CAPILLARY
GLUCOSE-CAPILLARY: 153 mg/dL — AB (ref 65–99)
Glucose-Capillary: 97 mg/dL (ref 65–99)

## 2016-10-14 LAB — PROTIME-INR
INR: 1.11
Prothrombin Time: 14.3 seconds (ref 11.4–15.2)

## 2016-10-14 LAB — APTT

## 2016-10-14 LAB — TROPONIN I: Troponin I: 0.03 ng/mL (ref <0.03)

## 2016-10-14 LAB — HEPARIN LEVEL (UNFRACTIONATED): Heparin Unfractionated: 0.37 IU/mL (ref 0.30–0.70)

## 2016-10-14 MED ORDER — SODIUM CHLORIDE 0.9 % IV SOLN: 250.0000 mL | INTRAVENOUS | Status: DC | PRN

## 2016-10-14 MED ORDER — ACETAMINOPHEN 650 MG RE SUPP: 650.0000 mg | Freq: Four times a day (QID) | RECTAL | Status: DC | PRN

## 2016-10-14 MED ORDER — DIPHENHYDRAMINE HCL 25 MG PO CAPS: 50.0000 mg | ORAL_CAPSULE | Freq: Once | ORAL | Status: DC

## 2016-10-14 MED ORDER — HYDRALAZINE HCL 20 MG/ML IJ SOLN: 10.0000 mg | INTRAMUSCULAR | Status: DC | PRN

## 2016-10-14 MED ORDER — ACETAMINOPHEN 325 MG PO TABS: 650.0000 mg | ORAL_TABLET | Freq: Four times a day (QID) | ORAL | Status: DC | PRN

## 2016-10-14 MED ORDER — IPRATROPIUM-ALBUTEROL 0.5-2.5 (3) MG/3ML IN SOLN: 3.0000 mL | Freq: Four times a day (QID) | RESPIRATORY_TRACT | Status: DC

## 2016-10-14 MED ORDER — ONDANSETRON HCL 4 MG/2ML IJ SOLN: 4.0000 mg | Freq: Four times a day (QID) | INTRAMUSCULAR | Status: DC | PRN

## 2016-10-14 MED ORDER — ASPIRIN 81 MG PO CHEW
324.0000 mg | CHEWABLE_TABLET | Freq: Once | ORAL | Status: AC
  Administered 2016-10-14: 324 mg via ORAL
  Filled 2016-10-14: qty 4

## 2016-10-14 MED ORDER — ASPIRIN 81 MG PO TABS: 81.0000 mg | ORAL_TABLET | Freq: Every day | ORAL | Status: DC

## 2016-10-14 MED ORDER — MORPHINE SULFATE (PF) 2 MG/ML IV SOLN
2.0000 mg | INTRAVENOUS | Status: DC | PRN
  Administered 2016-10-14 – 2016-10-15 (×2): 2 mg via INTRAVENOUS
  Filled 2016-10-14 (×2): qty 1

## 2016-10-14 MED ORDER — IPRATROPIUM-ALBUTEROL 0.5-2.5 (3) MG/3ML IN SOLN: 3.0000 mL | Freq: Four times a day (QID) | RESPIRATORY_TRACT | Status: DC | PRN

## 2016-10-14 MED ORDER — NITROGLYCERIN 0.4 MG SL SUBL
SUBLINGUAL_TABLET | SUBLINGUAL | Status: AC
  Administered 2016-10-14: 0.4 mg via SUBLINGUAL
  Filled 2016-10-14: qty 1

## 2016-10-14 MED ORDER — HYDROCHLOROTHIAZIDE 12.5 MG PO CAPS
12.5000 mg | ORAL_CAPSULE | Freq: Every day | ORAL | Status: DC
  Administered 2016-10-15 – 2016-10-16 (×2): 12.5 mg via ORAL
  Filled 2016-10-14 (×3): qty 1

## 2016-10-14 MED ORDER — SODIUM CHLORIDE 0.9% FLUSH
3.0000 mL | Freq: Two times a day (BID) | INTRAVENOUS | Status: DC
  Administered 2016-10-14 – 2016-10-16 (×4): 3 mL via INTRAVENOUS

## 2016-10-14 MED ORDER — LATANOPROST 0.005 % OP SOLN
1.0000 [drp] | Freq: Every day | OPHTHALMIC | Status: DC
Start: 2016-10-14 — End: 2016-10-16
  Administered 2016-10-14 – 2016-10-15 (×2): 1 [drp] via OPHTHALMIC
  Filled 2016-10-14: qty 2.5

## 2016-10-14 MED ORDER — LISINOPRIL-HYDROCHLOROTHIAZIDE 10-12.5 MG PO TABS: 1.0000 | ORAL_TABLET | Freq: Every day | ORAL | Status: DC

## 2016-10-14 MED ORDER — SODIUM CHLORIDE 0.9 % IV BOLUS (SEPSIS)
1000.0000 mL | Freq: Once | INTRAVENOUS | Status: AC
  Administered 2016-10-14: 1000 mL via INTRAVENOUS

## 2016-10-14 MED ORDER — SODIUM CHLORIDE 0.9% FLUSH: 3.0000 mL | INTRAVENOUS | Status: DC | PRN

## 2016-10-14 MED ORDER — DIPHENHYDRAMINE HCL 50 MG/ML IJ SOLN: 50.0000 mg | Freq: Once | INTRAMUSCULAR | Status: DC

## 2016-10-14 MED ORDER — INSULIN ASPART 100 UNIT/ML ~~LOC~~ SOLN: 0.0000 [IU] | Freq: Three times a day (TID) | SUBCUTANEOUS | Status: DC

## 2016-10-14 MED ORDER — SIMVASTATIN 20 MG PO TABS
20.0000 mg | ORAL_TABLET | Freq: Every evening | ORAL | Status: DC
  Administered 2016-10-15: 20 mg via ORAL
  Filled 2016-10-14 (×2): qty 1

## 2016-10-14 MED ORDER — BISACODYL 10 MG RE SUPP: 10.0000 mg | Freq: Every day | RECTAL | Status: DC | PRN

## 2016-10-14 MED ORDER — HEPARIN BOLUS VIA INFUSION
4000.0000 [IU] | Freq: Once | INTRAVENOUS | Status: AC
  Administered 2016-10-14: 4000 [IU] via INTRAVENOUS
  Filled 2016-10-14: qty 4000

## 2016-10-14 MED ORDER — DOCUSATE SODIUM 100 MG PO CAPS
100.0000 mg | ORAL_CAPSULE | Freq: Two times a day (BID) | ORAL | Status: DC
  Administered 2016-10-14 – 2016-10-16 (×4): 100 mg via ORAL
  Filled 2016-10-14 (×4): qty 1

## 2016-10-14 MED ORDER — PREDNISONE 50 MG PO TABS
50.0000 mg | ORAL_TABLET | Freq: Four times a day (QID) | ORAL | Status: AC
  Administered 2016-10-14: 50 mg via ORAL
  Filled 2016-10-14: qty 1

## 2016-10-14 MED ORDER — ONDANSETRON HCL 4 MG PO TABS: 4.0000 mg | ORAL_TABLET | Freq: Four times a day (QID) | ORAL | Status: DC | PRN

## 2016-10-14 MED ORDER — PANTOPRAZOLE SODIUM 40 MG IV SOLR
40.0000 mg | Freq: Two times a day (BID) | INTRAVENOUS | Status: DC
  Administered 2016-10-14 – 2016-10-16 (×4): 40 mg via INTRAVENOUS
  Filled 2016-10-14 (×5): qty 40

## 2016-10-14 MED ORDER — ASPIRIN EC 81 MG PO TBEC
81.0000 mg | DELAYED_RELEASE_TABLET | Freq: Every day | ORAL | Status: DC
  Administered 2016-10-15 – 2016-10-16 (×2): 81 mg via ORAL
  Filled 2016-10-14 (×2): qty 1

## 2016-10-14 MED ORDER — NITROGLYCERIN 2 % TD OINT
2.0000 [in_us] | TOPICAL_OINTMENT | Freq: Four times a day (QID) | TRANSDERMAL | Status: DC
  Administered 2016-10-14: 2 [in_us] via TOPICAL
  Administered 2016-10-14: 1 [in_us] via TOPICAL
  Administered 2016-10-15 (×3): 2 [in_us] via TOPICAL
  Filled 2016-10-14: qty 1
  Filled 2016-10-14: qty 2
  Filled 2016-10-14 (×3): qty 1

## 2016-10-14 MED ORDER — NITROGLYCERIN 0.4 MG SL SUBL
0.4000 mg | SUBLINGUAL_TABLET | SUBLINGUAL | Status: DC | PRN
  Administered 2016-10-14 (×3): 0.4 mg via SUBLINGUAL
  Filled 2016-10-14 (×2): qty 1

## 2016-10-14 MED ORDER — LISINOPRIL 10 MG PO TABS
10.0000 mg | ORAL_TABLET | Freq: Every day | ORAL | Status: DC
  Administered 2016-10-15 – 2016-10-16 (×2): 10 mg via ORAL
  Filled 2016-10-14 (×3): qty 1

## 2016-10-14 MED ORDER — HEPARIN (PORCINE) IN NACL 100-0.45 UNIT/ML-% IJ SOLN
750.0000 [IU]/h | INTRAMUSCULAR | Status: DC
  Administered 2016-10-14: 750 [IU]/h via INTRAVENOUS
  Filled 2016-10-14: qty 250

## 2016-10-14 MED ORDER — METOPROLOL TARTRATE 25 MG PO TABS
25.0000 mg | ORAL_TABLET | Freq: Two times a day (BID) | ORAL | Status: DC
  Administered 2016-10-14 – 2016-10-15 (×2): 25 mg via ORAL
  Filled 2016-10-14 (×3): qty 1

## 2016-10-14 NOTE — ED Triage Notes (Signed)
Pt arrived via ems for c/o dizziness - [place] to feel diz[place] so she went down to her knees and spouse called ems - she denies chest pain - reports shortness of breath but appears extremely anxious - denies N/V - CBG 124 - initial BP 204/110

## 2016-10-14 NOTE — ED Notes (Signed)
Lab called and stated that the green and blue top were hemolyzed for the 2nd time - requested lab to come and draw the green and blue top

## 2016-10-14 NOTE — ED Provider Notes (Signed)
[facility] Emergency Department Provider Note  Time seen: 11:37 AM  I have reviewed the triage vital signs and the nursing notes.   HISTORY  Chief Complaint Dizziness    HPI Mallory Walters is a 76 y.o. female with a past medical history of diabetes, hypertension, hyperlipidemia who presents to the emergency department with dizziness and nausea. According to the patient she was [place] the fle[place] walking around when she acutely became dizzy which she describes as spinning sensation, she fell to the ground onto her knees, did not hit her head or pass out. States she felt very nauseated as well. Denies any chest pain at any time, denies shortness of breath, abdominal pain, vomiting or diarrhea. Patient states she feels somewhat tremulous currently but she also states this could just be because she is worried about what happened. She continues to state mild dizziness. Denies any weakness or numbness. Denies confusion or slurred speech. The patient does have a history of facial palsy due to Bell's palsy 50 years ago.  Past Medical History:  Diagnosis Date  . Arthritis   . Cancer ([place])   . Diabetes mellitus without complication ([place])   . Hyperlipidemia   . Hypertension     Patient Active Problem List   Diagnosis Date Noted  . CA skin, basal cell 04/30/2014  . Diabetes mellitus, type 2 ([place]) 04/30/2014  . Colon, diverticulosis 04/30/2014  . HLD (hyperlipidemia) 04/30/2014  . BP (high blood pressure) 04/30/2014  . Bursitis, trochanteric 07/04/2013  . H/O total hip arthroplasty 06/16/2013  . Adult BMI 30+ 05/19/2013  . Primary localized osteoarthrosis of pelvic region 03/20/2013  . Arthralgia of hip 10/10/2011  . Plantar fasciitis 10/10/2011    Past Surgical History:  Procedure Laterality Date  . TOTAL HIP ARTHROPLASTY      Prior to Admission medications   Medication Sig Start Date End Date Taking? Authorizing Provider  aspirin 81 MG tablet Take 81 mg by  mouth daily.    [provider]  lisinopril-hydrochlorothiazide Reita May) 20-12.5 MG per tablet  04/13/14   [provider]  metFORMIN (GLUCOPHAGE-XR) 500 MG 24 hr tablet  04/13/14   [provider]  simvastatin (ZOCOR) 20 MG tablet  03/13/14   [provider]    Allergies  Allergen Reactions  . Codeine Other (See Comments)    Other Reaction: GI Upset  . Iodinated Diagnostic Agents Shortness Of Breath  . Iopamidol Shortness Of Breath  . Tramadol Nausea And Vomiting  . Penicillin G Hives    No family history on file.  Social History Social History  Substance Use Topics  . Smoking status: Former Research scientist (life sciences)  . Smokeless tobacco: Never Used  . Alcohol use No    Review of Systems Constitutional: Negative for fever. Cardiovascular: Negative for chest pain. Respiratory: Negative for shortness of breath. Gastrointestinal: Negative for abdominal pain. Positive nausea. Negative for vomiting or diarrhea. Genitourinary: Negative for dysuria. Neurological: Negative for headaches, focal weakness or numbness. All other ROS negative  ____________________________________________   PHYSICAL EXAM:  VITAL SIGNS: ED Triage Vitals  Enc Vitals Group     BP 10/14/16 1129 (!) 158/71     Pulse Rate 10/14/16 1129 78     Resp 10/14/16 1129 (!) 32     Temp 10/14/16 1129 97.7 F (36.5 C)     Temp Source 10/14/16 1129 Oral     SpO2 10/14/16 1129 100 %     Weight 10/14/16 1126 177 lb (80.3 kg)  Height 10/14/16 1126 4\' 11"  (1.499 m)     Head Circumference --      Peak Flow --      Pain Score 10/14/16 1126 0     Pain Loc --      Pain Edu? --      Excl. in [place]? --     Constitutional: Alert and oriented. Overall well appearing but somewhat anxious. Eyes: Normal exam ENT   Head: Normocephalic and atraumatic.   Mouth/Throat: Mucous membranes are moist. Cardiovascular: Normal rate, regular rhythm. No murmurs, rubs, or gallops. Respiratory:  Normal respiratory effort without tachypnea nor retractions. Breath sounds are clear  Gastrointestinal: Soft and nontender. No distention.   Musculoskeletal: Nontender with normal range of motion in all extremities. No lower extremity tenderness or edema. Neurologic:  Normal speech and language. No gross focal neurologic deficits. Equal grip strengths. Good movement of all extremities with 5/5 motor. Skin:  Skin is warm, dry and intact.  Psychiatric: Mood and affect are normal.   ____________________________________________    EKG  EKG reviewed and interpreted by myself shows normal sinus rhythm at 89 bpm, narrow QRS, normal axis with largely normal intervals and nonspecific but no concerning ST changes.  ____________________________________________    RADIOLOGY  Chest x-ray negative CT shows possible old infarct without acute infarct  ____________________________________________   INITIAL IMPRESSION / ASSESSMENT AND PLAN / ED COURSE  Pertinent labs & imaging results that were available during my care of the patient were reviewed by me and considered in my medical decision making (see chart for details).  Patient presents to the emergency department for dizziness and nausea which started acutely approximately 45 minutes ago. We will check labs, CT head, IV hydrate and closely monitor. Patient agreeable to plan.  Patient's troponin has resulted at 2.96, repeat EKG is reassuring. Patient given aspirin in the emergency department. A heparin drip has been ordered. Patient will require admission to the hospital for NSTEMI. BMP, urinalysis, CT head and chest x-ray are pending at this time.    CRITICAL CARE Performed by: Harvest Dark   Total critical care time: 30 minutes  Critical care time was exclusive of separately billable procedures and treating other patients.  Critical care was necessary to treat or prevent imminent or life-threatening deterioration.  Critical  care was time spent personally by me on the following activities: development of treatment plan with patient and/or surrogate as well as nursing, discussions with consultants, evaluation of patient's response to treatment, examination of patient, obtaining history from patient or surrogate, ordering and performing treatments and interventions, ordering and review of laboratory studies, ordering and review of radiographic studies, pulse oximetry and re-evaluation of patient's condition.   Repeat EKG reviewed and interpreted by myself shows normal sinus rhythm at 73 bpm, narrow QRS, left axis deviation, normal intervals with no concerning ST changes  Chest x-ray negative CT shows small older infarct no acute infarct  Repeat EKG shows what appears to be sinus rhythm at 89 bpm, narrow QRS, normal axis, largely normal intervals besides a prolonged QTC. No significant ST changes.  ____________________________________________   FINAL CLINICAL IMPRESSION(S) / ED DIAGNOSES  NSTEMI   Harvest Dark, MD 10/19/16 [phone]

## 2016-10-14 NOTE — ED Notes (Signed)
Patient transported to Ultrasound 

## 2016-10-14 NOTE — Progress Notes (Signed)
ANTICOAGULATION CONSULT NOTE - Initial Consult  Pharmacy Consult for Heparin Drip  Indication: chest pain/ACS  Allergies  Allergen Reactions  . Codeine Other (See Comments)    Other Reaction: GI Upset  . Iodinated Diagnostic Agents Shortness Of Breath  . Iopamidol Shortness Of Breath  . Tramadol Nausea And Vomiting  . Penicillin G Hives    .Has patient had a PCN reaction causing immediate rash, facial/tongue/throat swelling, SOB or lightheadedness with hypotension: Unknown Has patient had a PCN reaction causing severe rash involving mucus membranes or skin necrosis: Unknown Has patient had a PCN reaction that required hospitalization: Unknown Has patient had a PCN reaction occurring within the last 10 years: Unknown If all of the above answers are "NO", then may proceed with Cephalosporin use.     Patient Measurements: Height: 4\' 11"  (149.9 cm) Weight: 177 lb (80.3 kg) IBW/kg (Calculated) : 43.2 Heparin Dosing Weight: 62kg  Vital Signs: Temp: 97.7 F (36.5 C) (08/11 1129) Temp Source: Oral (08/11 1129) BP: 152/116 (08/11 1131) Pulse Rate: 89 (08/11 1131)  Labs:  Recent Labs  10/14/16 1128 10/14/16 1137  HGB 16.1*  --   HCT 47.6*  --   PLT 407  --   TROPONINI  --  2.96*    CrCl cannot be calculated (Patient's most recent lab result is older than the maximum 21 days allowed.).   Medical History: Past Medical History:  Diagnosis Date  . Arthritis   . Cancer ([place])   . Diabetes mellitus without complication ([employer])   . Hyperlipidemia   . Hypertension     Assessment: 76 yo female with dizziness and elevated troponin. Pharmacy consulted for heparin drip dosing and monitoring.   Goal of Therapy:  Heparin level 0.3-0.7 units/ml Monitor platelets by anticoagulation protocol: Yes   Plan:  Baseline labs ordered Heparin dosing Weight: 62kg Give 4000 units bolus x 1 Start heparin infusion at 750 units/hr Check anti-Xa level in 8 hours and daily while on  heparin Continue to monitor H&H and platelets  Pernell Dupre, PharmD, BCPS Clinical Pharmacist 10/14/2016 1:21 PM

## 2016-10-14 NOTE — ED Notes (Signed)
Dr Doy Hutching notified of BMP results at his request

## 2016-10-14 NOTE — ED Notes (Signed)
Elevated troponin of 2.96 reported to Dr Mable Paris - he is notifying Dr Kerman Passey for new orders

## 2016-10-14 NOTE — Progress Notes (Addendum)
ANTICOAGULATION CONSULT NOTE - Initial Consult  Pharmacy Consult for Heparin Drip  Indication: chest pain/ACS  Allergies  Allergen Reactions  . Codeine Other (See Comments)    Other Reaction: GI Upset  . Iodinated Diagnostic Agents Shortness Of Breath  . Iopamidol Shortness Of Breath  . Tramadol Nausea And Vomiting  . Penicillin G Hives    .Has patient had a PCN reaction causing immediate rash, facial/tongue/throat swelling, SOB or lightheadedness with hypotension: Unknown Has patient had a PCN reaction causing severe rash involving mucus membranes or skin necrosis: Unknown Has patient had a PCN reaction that required hospitalization: Unknown Has patient had a PCN reaction occurring within the last 10 years: Unknown If all of the above answers are "NO", then may proceed with Cephalosporin use.     Patient Measurements: Height: 4\' 11"  (149.9 cm) Weight: 177 lb (80.3 kg) IBW/kg (Calculated) : 43.2 Heparin Dosing Weight: 62kg  Vital Signs: Temp: 97.5 F (36.4 C) (08/11 1941) Temp Source: Oral (08/11 1941) BP: 128/62 (08/11 2159) Pulse Rate: 52 (08/11 2159)  Labs:  Recent Labs  10/14/16 1128 10/14/16 1137 10/14/16 1402 10/14/16 1722 10/14/16 2213  HGB 16.1*  --   --   --   --   HCT 47.6*  --   --   --   --   PLT 407  --   --   --   --   APTT  --   --  >160*  --   --   LABPROT  --   --  14.3  --   --   INR  --   --  1.11  --   --   HEPARINUNFRC  --   --   --   --  0.37  CREATININE  --   --  0.91  --   --   TROPONINI  --  2.96*  --  0.03*  --     Estimated Creatinine Clearance: 48.9 mL/min (by C-G formula based on SCr of 0.91 mg/dL).   Medical History: Past Medical History:  Diagnosis Date  . Arthritis   . Cancer ([place])   . Diabetes mellitus without complication ([place])   . Hyperlipidemia   . Hypertension     Assessment: 76 yo female with dizziness and elevated troponin. Pharmacy consulted for heparin drip dosing and monitoring.   Goal of Therapy:   Heparin level 0.3-0.7 units/ml Monitor platelets by anticoagulation protocol: Yes   Plan:  Baseline labs ordered Heparin dosing Weight: 62kg Give 4000 units bolus x 1 Start heparin infusion at 750 units/hr Check anti-Xa level in 8 hours and daily while on heparin Continue to monitor H&H and platelets    8/11 2200 heparin level 0.37. Continue current regimen. Recheck heparin level with AM labs to confirm. CBC in AM.  8/12 AM heparin level 0.30. Continue current regimen. Recheck heparin level and CBC with tomorrow AM labs.   Eloise Harman, PharmD, BCPS Clinical Pharmacist 10/14/2016 10:46 PM

## 2016-10-14 NOTE — Progress Notes (Addendum)
Review orders with Pt about Prednisone and benadryl for CT--angio. Pt is refusing prednisone and benadryl because she states that she does not want to risk having a CT with contrast/dye because in the past she had dye injected and she got really sick to the point that "they had to call a code blue" on her.

## 2016-10-14 NOTE — ED Notes (Signed)
Redraw of blue and green top for lab

## 2016-10-14 NOTE — H&P (Signed)
History and Physical    Mallory Walters OMV:[id number] DOB: 1940/06/03 DOA: 10/14/2016  Referring physician: Dr. Kerman Passey PCP: Hortencia Pilar, MD  Specialists: none  Chief Complaint: CP, near syncope, SOB  HPI: Mallory Walters is a 76 y.o. female has a past medical history significant for HTN, DM, and HLD now with progressive SOB with DOE and LE edema. Some dizziness and near syncope. Now with CP. Brought to ER where troponin is elevated c/w NSTEMI. Denies cardiac hx but has strong FH of CAD and multiple risk factors. She is now admitted. No fever. Denies N/V/D. Had acute onset of sx's 2 days ago. No actual syncope.  Review of Systems: The patient denies anorexia, fever, weight loss,, vision loss, decreased hearing, hoarseness,  Syncope,  balance deficits, hemoptysis, abdominal pain, melena, hematochezia, severe indigestion/heartburn, hematuria, incontinence, genital sores, muscle weakness, suspicious skin lesions, transient blindness, difficulty walking, depression, unusual weight change, abnormal bleeding, enlarged lymph nodes, angioedema, and breast masses.   Past Medical History:  Diagnosis Date  . Arthritis   . Cancer ([place])   . Diabetes mellitus without complication ([place])   . Hyperlipidemia   . Hypertension    Past Surgical History:  Procedure Laterality Date  . TOTAL HIP ARTHROPLASTY     Social History:  reports that she has quit smoking. She has never used smokeless tobacco. She reports that she does not drink alcohol or use drugs.  Allergies  Allergen Reactions  . Codeine Other (See Comments)    Other Reaction: GI Upset  . Iodinated Diagnostic Agents Shortness Of Breath  . Iopamidol Shortness Of Breath  . Tramadol Nausea And Vomiting  . Penicillin G Hives    .Has patient had a PCN reaction causing immediate rash, facial/tongue/throat swelling, SOB or lightheadedness with hypotension: Unknown Has patient had a PCN reaction causing severe rash involving mucus membranes  or skin necrosis: Unknown Has patient had a PCN reaction that required hospitalization: Unknown Has patient had a PCN reaction occurring within the last 10 years: Unknown If all of the above answers are "NO", then may proceed with Cephalosporin use.     History reviewed. No pertinent family history.  Prior to Admission medications   Medication Sig Start Date End Date Taking? Authorizing Provider  aspirin 81 MG tablet Take 81 mg by mouth daily.   Yes [provider]  latanoprost (XALATAN) 0.005 % ophthalmic solution Place 1 drop into the left eye at bedtime. 08/23/16  Yes [provider]  lisinopril-hydrochlorothiazide (PRINZIDE,ZESTORETIC) 10-12.5 MG tablet Take 1 tablet by mouth daily. 08/23/16  Yes [provider]  simvastatin (ZOCOR) 20 MG tablet Take 20 mg by mouth daily.  03/13/14  Yes [provider]  metFORMIN (GLUCOPHAGE-XR) 500 MG 24 hr tablet Take 500 mg by mouth daily.  04/13/14   [provider]   Physical Exam: Vitals:   10/14/16 1126 10/14/16 1129 10/14/16 1131  BP:  (!) 158/71 (!) 152/116  Pulse:  78 89  Resp:  (!) 32 (!) 27  Temp:  97.7 F (36.5 C)   TempSrc:  Oral   SpO2:  100% 100%  Weight: 80.3 kg (177 lb)    Height: 4\' 11"  (1.499 m)       General:  No apparent distress, WDWN, [place]/AT  Eyes: PERRL, EOMI, no scleral icterus, conjunctiva clear  ENT: moist oropharynx without exudate, TM's benign, dentition good  Neck: supple, no lymphadenopathy. No bruits or thyromegaly  Cardiovascular: regular rate without MRG; 2+ peripheral pulses, no JVD,  trace peripheral edema  Respiratory: CTA biL, good air movement without wheezing, rhonchi or crackled. Respiratory effort normal  Abdomen: soft, non tender to palpation, positive bowel sounds, no guarding, no rebound  Skin: no rashes or lesions  Musculoskeletal: normal bulk and tone, no joint swelling  Psychiatric: normal mood and affect, A&OX3  Neurologic: CN 2-12 grossly  intact, Motor strength 5/5 in all 4 groups with symmetric DTR's and non-focal sensory exam.  Labs on Admission:  Basic Metabolic Panel: No results for input(s): NA, K, CL, CO2, GLUCOSE, BUN, CREATININE, CALCIUM, MG, PHOS in the last 168 hours. Liver Function Tests: No results for input(s): AST, ALT, ALKPHOS, BILITOT, PROT, ALBUMIN in the last 168 hours. No results for input(s): LIPASE, AMYLASE in the last 168 hours. No results for input(s): AMMONIA in the last 168 hours. CBC:  Recent Labs Lab 10/14/16 1128  WBC 7.4  HGB 16.1*  HCT 47.6*  MCV 92.1  PLT 407   Cardiac Enzymes:  Recent Labs Lab 10/14/16 1137  TROPONINI 2.96*    BNP (last 3 results) No results for input(s): BNP in the last 8760 hours.  ProBNP (last 3 results) No results for input(s): PROBNP in the last 8760 hours.  CBG: No results for input(s): GLUCAP in the last 168 hours.  Radiological Exams on Admission: Ct Head Wo Contrast  Result Date: 10/14/2016 CLINICAL DATA:  Became dizzy while shopping this morning, fell Tower knees but did not strike her head, history hypertension, diabetes mellitus EXAM: CT HEAD WITHOUT CONTRAST TECHNIQUE: Contiguous axial images were obtained from the base of the skull through the vertex without intravenous contrast. COMPARISON:  None FINDINGS: Brain: Normal ventricular morphology. No midline shift or mass effect. Question small old lacunar infarct versus prominent perivascular space at RIGHT basal ganglia. No intracranial hemorrhage, mass lesion, or evidence of cortical infarction. No extra-axial fluid collections. Vascular: Unremarkable Skull: Intact Sinuses/Orbits: Clear Other: N/A IMPRESSION: Question small old lacunar infarct versus prominent perivascular space at RIGHT basal ganglia. No definite acute intracranial abnormalities. Electronically Signed   By: Lavonia Dana M.D.   On: 10/14/2016 12:53   Dg Chest Portable 1 View  Result Date: 10/14/2016 CLINICAL DATA:  Sudden onset  of dizziness and near syncopal feeling, shortness of breath, history hypertension, diabetes mellitus, hyperlipidemia EXAM: PORTABLE CHEST 1 VIEW COMPARISON:  Portable exam 1246 hours compared to 05/04/2012 FINDINGS: Normal heart size, mediastinal contours, and pulmonary vascularity. Lungs clear. No pleural effusion or pneumothorax. Bones demineralized. IMPRESSION: No acute abnormalities. Electronically Signed   By: Lavonia Dana M.D.   On: 10/14/2016 12:58    EKG: Independently reviewed.  Assessment/Plan Principal Problem:   NSTEMI (non-ST elevated myocardial infarction) ([place]) Active Problems:   Diabetes mellitus, type 2 ([place])   Near syncope   Accelerated hypertension   Will admit to telemetry with Heparin drip, O2, NTP, and prn morphine. Follow enzymes and order echo. Cardiology consult ordered. Will perform [place] of carotid arteries due to near syncope and consider CT chest to r/o PE if renal fxn normal given CP and SOB and LE edema. Repeat labs in AM. Follow sugars.  Diet: clear liquids for now Fluids: saline lock DVT Prophylaxis: Lovenox  Code Status: FULL  Family Communication: yes  Disposition Plan: home  Time spent: 55 min

## 2016-10-14 NOTE — ED Notes (Signed)
Pt c/o 10/10 chest pain on left side of chest - non-radiating - c/o shortness of breath - reports the pain as a tightness and heaviness - Dr Kerman Passey notified and EKG repeated per order

## 2016-10-15 ENCOUNTER — Inpatient Hospital Stay
Admit: 2016-10-15 | Discharge: 2016-10-15 | Disposition: A | Discharge status: Home / Self Care | Payer: Medicare Other | Attending: Internal Medicine | Admitting: Internal Medicine

## 2016-10-15 LAB — GLUCOSE, CAPILLARY
GLUCOSE-CAPILLARY: 101 mg/dL — AB (ref 65–99)
Glucose-Capillary: 108 mg/dL — ABNORMAL HIGH (ref 65–99)
Glucose-Capillary: 122 mg/dL — ABNORMAL HIGH (ref 65–99)
Glucose-Capillary: 116 mg/dL — ABNORMAL HIGH (ref 65–99)

## 2016-10-15 LAB — COMPREHENSIVE METABOLIC PANEL
ALK PHOS: 52 U/L (ref 38–126)
ALT: 16 U/L (ref 14–54)
AST: 25 U/L (ref 15–41)
Albumin: 3.8 g/dL (ref 3.5–5.0)
Anion gap: 9 (ref 5–15)
BILIRUBIN TOTAL: 0.7 mg/dL (ref 0.3–1.2)
BUN: 10 mg/dL (ref 6–20)
CALCIUM: 8.9 mg/dL (ref 8.9–10.3)
CO2: 24 mmol/L (ref 22–32)
CREATININE: 0.74 mg/dL (ref 0.44–1.00)
Chloride: 103 mmol/L (ref 101–111)
GFR calc Af Amer: >60 mL/min (ref >60)
GFR calc non Af Amer: >60 mL/min (ref >60)
GLUCOSE: 138 mg/dL — AB (ref 65–99)
Potassium: 3.6 mmol/L (ref 3.5–5.1)
SODIUM: 136 mmol/L (ref 135–145)
TOTAL PROTEIN: 6.7 g/dL (ref 6.5–8.1)

## 2016-10-15 LAB — ECHOCARDIOGRAM COMPLETE
HEIGHTINCHES: 59 in
WEIGHTICAEL: 2846.4 [oz_av]

## 2016-10-15 LAB — CBC
HCT: 43.1 % (ref 35.0–47.0)
Hemoglobin: 14.7 g/dL (ref 12.0–16.0)
MCH: 31.8 pg (ref 26.0–34.0)
MCHC: 34.1 g/dL (ref 32.0–36.0)
MCV: 93.3 fL (ref 80.0–100.0)
PLATELETS: 234 10*3/uL (ref 150–440)
RBC: 4.62 MIL/uL (ref 3.80–5.20)
RDW: 13.5 % (ref 11.5–14.5)
WBC: 8 10*3/uL (ref 3.6–11.0)

## 2016-10-15 LAB — TROPONIN I: Troponin I: <0.03 ng/mL (ref <0.03)

## 2016-10-15 LAB — HEPARIN LEVEL (UNFRACTIONATED): HEPARIN UNFRACTIONATED: 0.3 [IU]/mL (ref 0.30–0.70)

## 2016-10-15 MED ORDER — SODIUM CHLORIDE 0.9 % IV SOLN: 250.0000 mL | INTRAVENOUS | Status: DC | PRN

## 2016-10-15 MED ORDER — SODIUM CHLORIDE 0.9% FLUSH
3.0000 mL | Freq: Two times a day (BID) | INTRAVENOUS | Status: DC
  Administered 2016-10-15: 3 mL via INTRAVENOUS

## 2016-10-15 MED ORDER — LEVOFLOXACIN 500 MG PO TABS
250.0000 mg | ORAL_TABLET | Freq: Every day | ORAL | Status: DC
  Administered 2016-10-15 – 2016-10-16 (×2): 250 mg via ORAL
  Filled 2016-10-15 (×2): qty 1

## 2016-10-15 MED ORDER — SODIUM CHLORIDE 0.9 % WEIGHT BASED INFUSION: 1.0000 mL/kg/h | INTRAVENOUS | Status: DC

## 2016-10-15 MED ORDER — ASPIRIN 81 MG PO CHEW: 81.0000 mg | CHEWABLE_TABLET | ORAL | Status: DC

## 2016-10-15 MED ORDER — SODIUM CHLORIDE 0.9 % WEIGHT BASED INFUSION: 3.0000 mL/kg/h | INTRAVENOUS | Status: DC

## 2016-10-15 MED ORDER — SODIUM CHLORIDE 0.9% FLUSH: 3.0000 mL | INTRAVENOUS | Status: DC | PRN

## 2016-10-15 NOTE — Progress Notes (Signed)
*  PRELIMINARY RESULTS* Echocardiogram 2D Echocardiogram has been performed.  Lavell Luster Morey Andonian 10/15/2016, 10:32 AM

## 2016-10-15 NOTE — Consult Note (Signed)
[place] [facility] Primary Physician   Primary Cardiologist   Reason for Consultation syncope  HPI: Pt is a 76 yo female with no history of cad who was admitted after suffering a syncopal episode. She was in herusual state of health when she became dizzy while shopping. She felt like the room was spinning and she fell to her knees. She did not lose consciousness. She presented to the er where her ekg was notrmal. Serum troonin was reported at 2.97 but subsequent values were all normal and the initial value was found to be eroneously reported. Echo showed normal lv function with no wall motion abnormality. Was found to have uti. She does report eeritonal dyspnea in the recent past. She has had no other episodes of syncope and feels normal at present. She has a family history of cad.   Review of Systems  Constitutional: Negative.   HENT: Negative.   Eyes: Negative.   Respiratory: Positive for shortness of breath.   Cardiovascular: Negative.   Gastrointestinal: Negative.   Genitourinary: Negative.   Musculoskeletal: Negative.   Skin: Negative.   Neurological: Positive for dizziness.  Endo/Heme/Allergies: Negative.   Psychiatric/Behavioral: Negative.     Past Medical History:  Diagnosis Date  . Arthritis   . Cancer ([place])   . Diabetes mellitus without complication ([place])   . Hyperlipidemia   . Hypertension     History reviewed. No pertinent family history.  Social History   Social History  . Marital status: Married    Spouse name: N/A  . Number of children: N/A  . Years of education: N/A   Occupational History  . Not on file.   Social History Main Topics  . Smoking status: Former Research scientist (life sciences)  . Smokeless tobacco: Never Used  . Alcohol use No  . Drug use: No  . Sexual activity: Not on file   Other Topics Concern  . Not on file   Social History Narrative  . No narrative on file    Past Surgical History:  Procedure Laterality Date  . TOTAL HIP  ARTHROPLASTY       Prescriptions Prior to Admission  Medication Sig Dispense Refill Last Dose  . aspirin 81 MG tablet Take 81 mg by mouth daily.   unknown at unknown  . latanoprost (XALATAN) 0.005 % ophthalmic solution Place 1 drop into the left eye at bedtime.  5 10/13/2016 at 2000  . lisinopril-hydrochlorothiazide (PRINZIDE,ZESTORETIC) 10-12.5 MG tablet Take 1 tablet by mouth daily.  2 10/13/2016 at 2000  . simvastatin (ZOCOR) 20 MG tablet Take 20 mg by mouth daily.   2 10/13/2016 at 2000  . metFORMIN (GLUCOPHAGE-XR) 500 MG 24 hr tablet Take 500 mg by mouth daily.   2 Not Taking at Unknown time    Physical Exam: Blood pressure (!) 116/52, pulse (!) 53, temperature 97.6 F (36.4 C), temperature source Oral, resp. rate 17, height 4\' 11"  (1.499 m), weight 80.7 kg (177 lb 14.4 oz), SpO2 99 %.   Wt Readings from Last 1 Encounters:  10/15/16 80.7 kg (177 lb 14.4 oz)     General appearance: alert and cooperative Resp: clear to auscultation bilaterally Cardio: regular rate and rhythm GI: soft, non-tender; bowel sounds normal; no masses,  no organomegaly Extremities: extremities normal, atraumatic, no cyanosis or edema Neurologic: Grossly normal  Labs:   Lab Results  Component Value Date   WBC 8.0 10/15/2016   HGB 14.7 10/15/2016   HCT 43.1 10/15/2016   MCV 93.3 10/15/2016  PLT 234 10/15/2016    Recent Labs Lab 10/15/16 0446  NA 136  K 3.6  CL 103  CO2 24  BUN 10  CREATININE 0.74  CALCIUM 8.9  PROT 6.7  BILITOT 0.7  ALKPHOS 52  ALT 16  AST 25  GLUCOSE 138*   Lab Results  Component Value Date   CKTOTAL 69 05/02/2012   CKMB 1.1 05/02/2012   TROPONINI <0.03 10/15/2016      Radiology: No acute abnormalities EKG: nsr with no ischemia  ASSESSMENT AND PLAN:  Pt with a near syncopal episode of unclear etiology. Has ruled out for an mi with normal troponin. Echo was unremarkable. Has exertional sob but no chest pain. Has uti which may be playing a role in her symptoms.  No further cardiac workup in patient is indicated. Would ambulate and consdier discharge. Will be glad to see as  Outpatient for further workup in desired by patient.  Signed: Teodoro Spray MD, [facility] 10/15/2016, 3:02 PM

## 2016-10-15 NOTE — Progress Notes (Signed)
[facility] [place] NAME: Cyann Venti    MR#:  [id number]  DATE OF BIRTH:  [birth year]  SUBJECTIVE:  CHIEF COMPLAINT:   Chief Complaint  Patient presents with  . Dizziness   Came with progressive shortness of breath, lower extremity edema, near syncopal episode. Troponin was elevated in ER but since then on further follow-up next 3 time series normal. No complaint of chest pain now. Had some dizziness and shaking yesterday when came to emergency room. Noted to have a urinary infection. REVIEW OF SYSTEMS:  CONSTITUTIONAL: No fever,Positive for fatigue or weakness.  EYES: No blurred or double vision.  EARS, NOSE, AND THROAT: No tinnitus or ear pain.  RESPIRATORY: No cough, shortness of breath, wheezing or hemoptysis.  CARDIOVASCULAR: No chest pain, orthopnea, edema.  GASTROINTESTINAL: No nausea, vomiting, diarrhea or abdominal pain.  GENITOURINARY: No dysuria, hematuria.  ENDOCRINE: No polyuria, nocturia,  HEMATOLOGY: No anemia, easy bruising or bleeding SKIN: No rash or lesion. MUSCULOSKELETAL: No joint pain or arthritis.   NEUROLOGIC: No tingling, numbness, weakness.  PSYCHIATRY: No anxiety or depression.   ROS  DRUG ALLERGIES:   Allergies  Allergen Reactions  . Codeine Other (See Comments)    Other Reaction: GI Upset  . Iodinated Diagnostic Agents Shortness Of Breath  . Iopamidol Shortness Of Breath  . Tramadol Nausea And Vomiting  . Penicillin G Hives    .Has patient had a PCN reaction causing immediate rash, facial/tongue/throat swelling, SOB or lightheadedness with hypotension: Unknown Has patient had a PCN reaction causing severe rash involving mucus membranes or skin necrosis: Unknown Has patient had a PCN reaction that required hospitalization: Unknown Has patient had a PCN reaction occurring within the last 10 years: Unknown If all of the above answers are "NO", then may proceed with Cephalosporin use.     VITALS:   Blood pressure (!) 123/59, pulse (!) 51, temperature 97.6 F (36.4 C), temperature source Oral, resp. rate 18, height 4\' 11"  (1.499 m), weight 80.7 kg (177 lb 14.4 oz), SpO2 99 %.  PHYSICAL EXAMINATION:  GENERAL:  76 y.o.-year-old patient lying in the bed with no acute distress.  EYES: Pupils equal, round, reactive to light and accommodation. No scleral icterus. Extraocular muscles intact.  HEENT: Head atraumatic, normocephalic. Oropharynx and nasopharynx clear.  NECK:  Supple, no jugular venous distention. No thyroid enlargement, no tenderness.  LUNGS: Normal breath sounds bilaterally, no wheezing, rales,rhonchi or crepitation. No use of accessory muscles of respiration.  CARDIOVASCULAR: S1, S2 normal. No murmurs, rubs, or gallops.  ABDOMEN: Soft, nontender, nondistended. Bowel sounds present. No organomegaly or mass.  EXTREMITIES: No pedal edema, cyanosis, or clubbing.  NEUROLOGIC: Cranial nerves II through XII are intact. Muscle strength 5/5 in all extremities. Sensation intact. Gait not checked.  PSYCHIATRIC: The patient is alert and oriented x 3.  SKIN: No obvious rash, lesion, or ulcer.   Physical Exam LABORATORY PANEL:   CBC  Recent Labs Lab 10/15/16 0446  WBC 8.0  HGB 14.7  HCT 43.1  PLT 234   ------------------------------------------------------------------------------------------------------------------  Chemistries   Recent Labs Lab 10/15/16 0446  NA 136  K 3.6  CL 103  CO2 24  GLUCOSE 138*  BUN 10  CREATININE 0.74  CALCIUM 8.9  AST 25  ALT 16  ALKPHOS 52  BILITOT 0.7   ------------------------------------------------------------------------------------------------------------------  Cardiac Enzymes  Recent Labs Lab 10/14/16 2213 10/15/16 0446  TROPONINI <0.03 <0.03   ------------------------------------------------------------------------------------------------------------------  RADIOLOGY:  Ct Head Wo Contrast  Result  Date:  10/14/2016 CLINICAL DATA:  Became dizzy while shopping this morning, fell Tower knees but did not strike her head, history hypertension, diabetes mellitus EXAM: CT HEAD WITHOUT CONTRAST TECHNIQUE: Contiguous axial images were obtained from the base of the skull through the vertex without intravenous contrast. COMPARISON:  None FINDINGS: Brain: Normal ventricular morphology. No midline shift or mass effect. Question small old lacunar infarct versus prominent perivascular space at RIGHT basal ganglia. No intracranial hemorrhage, mass lesion, or evidence of cortical infarction. No extra-axial fluid collections. Vascular: Unremarkable Skull: Intact Sinuses/Orbits: Clear Other: N/A IMPRESSION: Question small old lacunar infarct versus prominent perivascular space at RIGHT basal ganglia. No definite acute intracranial abnormalities. Electronically Signed   By: Lavonia Dana M.D.   On: 10/14/2016 12:53   US Carotid Bilateral  Result Date: 10/14/2016 CLINICAL DATA:  Near syncope EXAM: BILATERAL CAROTID DUPLEX ULTRASOUND TECHNIQUE: Pearline Cables scale imaging, color Doppler and duplex ultrasound were performed of bilateral carotid and vertebral arteries in the neck. COMPARISON:  None. FINDINGS: Criteria: Quantification of carotid stenosis is based on velocity parameters that correlate the residual internal carotid diameter with NASCET-based stenosis levels, using the diameter of the distal internal carotid lumen as the denominator for stenosis measurement. The following velocity measurements were obtained: RIGHT ICA:  89 cm/sec CCA:  83 cm/sec SYSTOLIC ICA/CCA RATIO:  1.1 DIASTOLIC ICA/CCA RATIO:  2.3 ECA:  127 cm/sec LEFT ICA:  107 cm/sec CCA:  91 cm/sec SYSTOLIC ICA/CCA RATIO:  1.2 DIASTOLIC ICA/CCA RATIO:  2.7 ECA:  91 cm/sec RIGHT CAROTID ARTERY: Little if any plaque in the bulb. Low resistance internal carotid Doppler pattern. RIGHT VERTEBRAL ARTERY:  Antegrade. LEFT CAROTID ARTERY: Little if any plaque in the bulb. Low  resistance internal carotid Doppler pattern. LEFT VERTEBRAL ARTERY:  Antegrade. IMPRESSION: Less than 50% stenosis in the right and left internal carotid arteries. Electronically Signed   By: Marybelle Killings M.D.   On: 10/14/2016 15:48   Dg Chest Portable 1 View  Result Date: 10/14/2016 CLINICAL DATA:  Sudden onset of dizziness and near syncopal feeling, shortness of breath, history hypertension, diabetes mellitus, hyperlipidemia EXAM: PORTABLE CHEST 1 VIEW COMPARISON:  Portable exam 1246 hours compared to 05/04/2012 FINDINGS: Normal heart size, mediastinal contours, and pulmonary vascularity. Lungs clear. No pleural effusion or pneumothorax. Bones demineralized. IMPRESSION: No acute abnormalities. Electronically Signed   By: Lavonia Dana M.D.   On: 10/14/2016 12:58    ASSESSMENT AND PLAN:   Active Problems:   Diabetes mellitus, type 2 (HCC)   Near syncope   Accelerated hypertension   * Non-ST elevation MI- ruled out.   This was suspected on admission but it is ruled out.   Lungs like the first troponin which was tested in ER, may be the lab of collection error.   On heparin drip, seen by cardiologist.    Plan is to follow her echocardiogram and most likely no further workup.  * UTI   IV ceftriaxone, follow urine culture.  * Syncopal episode   Likely secondary to UTI.   Echocardiogram is ordered, with good result.   Carotid Doppler study shows less than 50% stenosis   CT head shows some old stroke but no acute finding.  * Hypertension   Continue lisinopril, metoprolol.  * Hyperlipidemia   Continue simvastatin.      All the records are reviewed and case discussed with Care Management/Social Workerr. Management plans discussed with the patient, family and they are in agreement.  CODE STATUS: Full code.  TOTAL TIME  TAKING CARE OF THIS PATIENT: 35 minutes.   Discussed with patient's husband and sister in the room.  Also discussed with cardiologist and nurse on the  floor.  POSSIBLE D/C IN 1-2 DAYS, DEPENDING ON CLINICAL CONDITION.   Vaughan Basta M.D on 10/15/2016   Between 7am to 6pm - Pager - [phone]  After 6pm go to [email or web address] - password [email or web address] [facility]  Office  [phone]  CC: Primary care physician; Hortencia Pilar, MD  Note: This dictation was prepared with Dragon dictation along with smaller phrase technology. Any transcriptional errors that result from this process are unintentional.

## 2016-10-15 NOTE — Progress Notes (Signed)
Pharmacy Antibiotic Note  Mallory Walters is a 76 y.o. female admitted on 10/14/2016 with UTI.  Pharmacy has been consulted for levofloxacin dosing.  Plan:  levofloxacin 250mg  po daily  Height: 4\' 11"  (149.9 cm) Weight: 177 lb 14.4 oz (80.7 kg) IBW/kg (Calculated) : 43.2  Temp (24hrs), Avg:97.7 F (36.5 C), Min:97.5 F (36.4 C), Max:97.8 F (36.6 C)   Recent Labs Lab 10/14/16 1128 10/14/16 1402 10/15/16 0446  WBC 7.4  --  8.0  CREATININE  --  0.91 0.74    Estimated Creatinine Clearance: 55.8 mL/min (by C-G formula based on SCr of 0.74 mg/dL).    Allergies  Allergen Reactions  . Codeine Other (See Comments)    Other Reaction: GI Upset  . Iodinated Diagnostic Agents Shortness Of Breath  . Iopamidol Shortness Of Breath  . Tramadol Nausea And Vomiting  . Penicillin G Hives    .Has patient had a PCN reaction causing immediate rash, facial/tongue/throat swelling, SOB or lightheadedness with hypotension: Unknown Has patient had a PCN reaction causing severe rash involving mucus membranes or skin necrosis: Unknown Has patient had a PCN reaction that required hospitalization: Unknown Has patient had a PCN reaction occurring within the last 10 years: Unknown If all of the above answers are "NO", then may proceed with Cephalosporin use.     Microbiology results: 8/12 UCx:  Pending     Thank you for allowing pharmacy to be a part of this patient's care.  Mallory Walters 10/15/2016 10:39 AM

## 2016-10-15 NOTE — Progress Notes (Signed)
md fath was notified that the first troponin was an incorrect draw per the lab staff. No further orders at this time.

## 2016-10-16 ENCOUNTER — Encounter
Admission: EM | Disposition: A | Discharge status: Home / Self Care | Payer: Self-pay | Source: Home / Self Care | Attending: Internal Medicine

## 2016-10-16 LAB — GLUCOSE, CAPILLARY
GLUCOSE-CAPILLARY: 128 mg/dL — AB (ref 65–99)
GLUCOSE-CAPILLARY: 109 mg/dL — AB (ref 65–99)

## 2016-10-16 SURGERY — LEFT HEART CATH AND CORONARY ANGIOGRAPHY
Anesthesia: Moderate Sedation

## 2016-10-16 MED ORDER — METOPROLOL TARTRATE 25 MG PO TABS: 12.5000 mg | ORAL_TABLET | Freq: Two times a day (BID) | ORAL | 0 refills | Status: AC

## 2016-10-16 MED ORDER — LEVOFLOXACIN 250 MG PO TABS: 250.0000 mg | ORAL_TABLET | Freq: Every day | ORAL | 0 refills | Status: AC

## 2016-10-16 MED ORDER — PANTOPRAZOLE SODIUM 40 MG PO TBEC: 40.0000 mg | DELAYED_RELEASE_TABLET | Freq: Every day | ORAL | Status: DC

## 2016-10-16 NOTE — Progress Notes (Signed)
The patient is receiving Protonix by the intravenous route.  Based on criteria approved by the Pharmacy and [place], the medication is being converted to the equivalent oral dose form.  These criteria include: -No active GI bleeding -Able to tolerate diet of full liquids (or better) or tube feeding -Able to tolerate other medications by the oral or enteral route  If you have any questions about this conversion, please contact the Pharmacy Department

## 2016-10-16 NOTE — Evaluation (Signed)
Physical Therapy Evaluation Patient Details Name: Mallory Walters MRN: [id number] DOB: 10/06/1940 Today's Date: 10/16/2016   History of Present Illness  Pt is a 76 y.o. F who presented to the ED with c/o dizziness, L sided chest pain, SOB, a BP of 204/110 mmHg and a near syncopal event. Pt admitted 10/14/2016 with near syncapol event. PMH: HTN, DM, HLD, SOB, cancer, THA.    Clinical Impression  Prior to admission pt reports independence with ADLs and functional mobility. Pt reports family available 24 hours/day upon discharge. Pt currently modified independent for bed mobility and CGA for functional mobility/ambulation; pt demonstrates stumble requiring min assist from therapist with head turn to R during ambulation. Pt A and O x4 throughout session. Pt denies pain throughout session. Pt will continue to benefit from skilled PT to address balance and strength deficits. Recommended discharge to home with HHPT.    Follow Up Recommendations Home health PT    Equipment Recommendations  None recommended by PT    Recommendations for Other Services       Precautions / Restrictions Precautions Precautions: Fall Restrictions Weight Bearing Restrictions: No      Mobility  Bed Mobility Overal bed mobility: Modified Independent             General bed mobility comments: pt modified independent for supine to/from sit with HOB elevated  Transfers Overall transfer level: Needs assistance   Transfers: Sit to/from Stand Sit to Stand: Min guard         General transfer comment: initial vc's for B LE placement for sit to stand  Ambulation/Gait Ambulation/Gait assistance: Min guard Ambulation Distance (Feet): 250 Feet   Gait Pattern/deviations: Decreased step length - right;Decreased step length - left Gait velocity: decreased   General Gait Details: pt able to ambulate 125 ft to stairs with CGA; pt performed stairs; pt able to ambulate 125 ft to EOB from stairs with  CGA  Stairs Stairs: Yes Stairs assistance: Min guard Stair Management: One rail Right Number of Stairs: 7 General stair comments: pt able to perform stairs with use of R sided railing for ascending and descending   Wheelchair Mobility    Modified Rankin (Stroke Patients Only)       Balance Overall balance assessment: Needs assistance Sitting-balance support: Feet supported Sitting balance-Leahy Scale: Good Sitting balance - Comments: able to perform static sitting balance with no overt loss of balance noted   Standing balance support: No upper extremity supported Standing balance-Leahy Scale: Fair Standing balance comment: pt able to maintain static standing balance with no loss of balance noted             High level balance activites: Head turns High Level Balance Comments: pt demonstrates stumble wiith head turn to the right with ambulation; pt able to catch herself with min assist from therapist             Pertinent Vitals/Pain Pain Assessment: No/denies pain  HR - 70 to 83 bpm throughout session via telemetry O2 - 96 to 98% on RA throughout session via pulse ox    Home Living Family/patient expects to be discharged to:: Private residence Living Arrangements: Spouse/significant other Available Help at Discharge: Family;Available 24 hours/day Type of Home: House Home Access: Stairs to enter Entrance Stairs-Rails: Can reach both Entrance Stairs-Number of Steps: 3 Home Layout: Two level;Able to live on main level with bedroom/bathroom   Additional Comments: has a ledge next to toilet she can use as a grab bar to stand  if needed    Prior Function Level of Independence: Independent               Hand Dominance        Extremity/Trunk Assessment   Upper Extremity Assessment Upper Extremity Assessment: Overall WFL for tasks assessed    Lower Extremity Assessment Lower Extremity Assessment: Generalized weakness       Communication    Communication: No difficulties  Cognition Arousal/Alertness: Awake/alert Behavior During Therapy: WFL for tasks assessed/performed Overall Cognitive Status: Within Functional Limits for tasks assessed                                        General Comments      Exercises     Assessment/Plan    PT Assessment Patient needs continued PT services  PT Problem List Decreased strength;Decreased activity tolerance;Decreased balance;Decreased mobility       PT Treatment Interventions DME instruction;Gait training;Stair training;Functional mobility training;Therapeutic activities;Therapeutic exercise;Balance training;Patient/family education    PT Goals (Current goals can be found in the Care Plan section)  Acute Rehab PT Goals Patient Stated Goal: to return home PT Goal Formulation: With patient Time For Goal Achievement: 10/30/16 Potential to Achieve Goals: Good    Frequency Min 2X/week   Barriers to discharge        Co-evaluation               AM-PAC PT "6 Clicks" Daily Activity  Outcome Measure Difficulty turning over in bed (including adjusting bedclothes, sheets and blankets)?: None Difficulty moving from lying on back to sitting on the side of the bed? : A Little Difficulty sitting down on and standing up from a chair with arms (e.g., wheelchair, bedside commode, etc,.)?: A Little Help needed moving to and from a bed to chair (including a wheelchair)?: A Little Help needed walking in hospital room?: A Little Help needed climbing 3-5 steps with a railing? : A Little 6 Click Score: 19    End of Session Equipment Utilized During Treatment: Gait belt Activity Tolerance: Patient tolerated treatment well Patient left: in bed;with call bell/phone within reach;with bed alarm set;with family/visitor present Nurse Communication: Mobility status PT Visit Diagnosis: Unsteadiness on feet (R26.81);Other abnormalities of gait and mobility (R26.89);Muscle  weakness (generalized) (M62.81)    Time: 4656-8127 PT Time Calculation (min) (ACUTE ONLY): 24 min   Charges:         PT G CodesLaqueta Carina, SPT 11-Nov-2016,3:08 PM [phone]

## 2016-10-16 NOTE — Progress Notes (Signed)
Pt reports no pain. Room air. Loletha Grayer. IV and tele removed. Prescriptions given to pt. Discharge instructions given to pt. Pt has no further concerns at this time.

## 2016-10-16 NOTE — Discharge Summary (Signed)
[facility] NAME: Mallory Walters    MR#:  [id number]  DATE OF BIRTH:  1940-12-12  DATE OF ADMISSION:  10/14/2016 ADMITTING PHYSICIAN: Idelle Crouch, MD  DATE OF DISCHARGE: 10/16/2016   PRIMARY CARE PHYSICIAN: Hortencia Pilar, MD    ADMISSION DIAGNOSIS:  NSTEMI (non-ST elevated myocardial infarction) ([place]) [I21.4] Near syncope [R55]  DISCHARGE DIAGNOSIS:  Active Problems:   Diabetes mellitus, type 2 ([place])   Near syncope   Accelerated hypertension   SECONDARY DIAGNOSIS:   Past Medical History:  Diagnosis Date  . Arthritis   . Cancer ([place])   . Diabetes mellitus without complication ([place])   . Hyperlipidemia   . Hypertension     HOSPITAL COURSE:   * Non-ST elevation MI- ruled out.   This was suspected on admission but it is ruled out.   the first troponin which was tested in ER, may be the lab of collection error. ( now corrected in report)   On heparin drip, seen by cardiologist.   3 more troponin remained negative.   Plan is to follow her echocardiogram and no further workup.   D/c heparin drip.  * UTI   IV ceftriaxone, follow urine culture.   Give levaquin oral on d/c.  * Syncopal episode   Likely secondary to UTI.   Echocardiogram is ordered, with good result.   Carotid Doppler study shows less than 50% stenosis   CT head shows some old stroke but no acute finding.  * Hypertension   Continue lisinopril, metoprolol.  * Hyperlipidemia   Continue simvastatin.  DISCHARGE CONDITIONS:   Stable.  CONSULTS OBTAINED:  Treatment Team:  Teodoro Spray, MD  DRUG ALLERGIES:   Allergies  Allergen Reactions  . Codeine Other (See Comments)    Other Reaction: GI Upset  . Iodinated Diagnostic Agents Shortness Of Breath  . Iopamidol Shortness Of Breath  . Tramadol Nausea And Vomiting  . Penicillin G Hives    .Has patient had a PCN reaction causing immediate rash, facial/tongue/throat swelling,  SOB or lightheadedness with hypotension: Unknown Has patient had a PCN reaction causing severe rash involving mucus membranes or skin necrosis: Unknown Has patient had a PCN reaction that required hospitalization: Unknown Has patient had a PCN reaction occurring within the last 10 years: Unknown If all of the above answers are "NO", then may proceed with Cephalosporin use.     DISCHARGE MEDICATIONS:   Current Discharge Medication List    START taking these medications   Details  levofloxacin (LEVAQUIN) 250 MG tablet Take 1 tablet (250 mg total) by mouth daily. Qty: 3 tablet, Refills: 0    metoprolol tartrate (LOPRESSOR) 25 MG tablet Take 0.5 tablets (12.5 mg total) by mouth 2 (two) times daily. Qty: 60 tablet, Refills: 0      CONTINUE these medications which have NOT CHANGED   Details  aspirin 81 MG tablet Take 81 mg by mouth daily.    latanoprost (XALATAN) 0.005 % ophthalmic solution Place 1 drop into the left eye at bedtime. Refills: 5    lisinopril-hydrochlorothiazide (PRINZIDE,ZESTORETIC) 10-12.5 MG tablet Take 1 tablet by mouth daily. Refills: 2    simvastatin (ZOCOR) 20 MG tablet Take 20 mg by mouth daily.  Refills: 2    metFORMIN (GLUCOPHAGE-XR) 500 MG 24 hr tablet Take 500 mg by mouth daily.  Refills: 2         DISCHARGE INSTRUCTIONS:    Follow with PMD in 1-2 weeks.  If you experience worsening of your admission symptoms, develop shortness of breath, life threatening emergency, suicidal or homicidal thoughts you must seek medical attention immediately by calling 911 or calling your MD immediately  if symptoms less severe.  You Must read complete instructions/literature along with all the possible adverse reactions/side effects for all the Medicines you take and that have been prescribed to you. Take any new Medicines after you have completely understood and accept all the possible adverse reactions/side effects.   Please note  You were cared for by a  hospitalist during your hospital stay. If you have any questions about your discharge medications or the care you received while you were in the hospital after you are discharged, you can call the unit and asked to speak with the hospitalist on call if the hospitalist that took care of you is not available. Once you are discharged, your primary care physician will handle any further medical issues. Please note that NO REFILLS for any discharge medications will be authorized once you are discharged, as it is imperative that you return to your primary care physician (or establish a relationship with a primary care physician if you do not have one) for your aftercare needs so that they can reassess your need for medications and monitor your lab values.    Today   CHIEF COMPLAINT:   Chief Complaint  Patient presents with  . Dizziness    HISTORY OF PRESENT ILLNESS:  Mallory Walters  is a 76 y.o. female with a known history of HTN, DM, and HLD now with progressive SOB with DOE and LE edema. Some dizziness and near syncope. Now with CP. Brought to ER where troponin is elevated c/w NSTEMI. Denies cardiac hx but has strong FH of CAD and multiple risk factors. She is now admitted. No fever. Denies N/V/D. Had acute onset of sx's 2 days ago. No actual syncope.  VITAL SIGNS:  Blood pressure 132/64, pulse (!) 55, temperature 97.9 F (36.6 C), temperature source Oral, resp. rate 17, height 4\' 11"  (1.499 m), weight 81.2 kg (179 lb), SpO2 100 %.  I/O:   Intake/Output Summary (Last 24 hours) at 10/16/16 1448 Last data filed at 10/16/16 0954  Gross per 24 hour  Intake              240 ml  Output              200 ml  Net               40 ml    PHYSICAL EXAMINATION:  GENERAL:  76 y.o.-year-old patient lying in the bed with no acute distress.  EYES: Pupils equal, round, reactive to light and accommodation. No scleral icterus. Extraocular muscles intact.  HEENT: Head atraumatic, normocephalic. Oropharynx and  nasopharynx clear.  NECK:  Supple, no jugular venous distention. No thyroid enlargement, no tenderness.  LUNGS: Normal breath sounds bilaterally, no wheezing, rales,rhonchi or crepitation. No use of accessory muscles of respiration.  CARDIOVASCULAR: S1, S2 normal. No murmurs, rubs, or gallops.  ABDOMEN: Soft, non-tender, non-distended. Bowel sounds present. No organomegaly or mass.  EXTREMITIES: No pedal edema, cyanosis, or clubbing.  NEUROLOGIC: Cranial nerves II through XII are intact. Muscle strength 5/5 in all extremities. Sensation intact. Gait not checked.  PSYCHIATRIC: The patient is alert and oriented x 3.  SKIN: No obvious rash, lesion, or ulcer.   DATA REVIEW:   CBC  Recent Labs Lab 10/15/16 0446  WBC 8.0  HGB 14.7  HCT 43.1  PLT 234    Chemistries   Recent Labs Lab 10/15/16 0446  NA 136  K 3.6  CL 103  CO2 24  GLUCOSE 138*  BUN 10  CREATININE 0.74  CALCIUM 8.9  AST 25  ALT 16  ALKPHOS 52  BILITOT 0.7    Cardiac Enzymes  Recent Labs Lab 10/15/16 0446  TROPONINI <0.03    Microbiology Results  No results found for this or any previous visit.  RADIOLOGY:  US Carotid Bilateral  Result Date: 10/14/2016 CLINICAL DATA:  Near syncope EXAM: BILATERAL CAROTID DUPLEX ULTRASOUND TECHNIQUE: Pearline Cables scale imaging, color Doppler and duplex ultrasound were performed of bilateral carotid and vertebral arteries in the neck. COMPARISON:  None. FINDINGS: Criteria: Quantification of carotid stenosis is based on velocity parameters that correlate the residual internal carotid diameter with NASCET-based stenosis levels, using the diameter of the distal internal carotid lumen as the denominator for stenosis measurement. The following velocity measurements were obtained: RIGHT ICA:  89 cm/sec CCA:  83 cm/sec SYSTOLIC ICA/CCA RATIO:  1.1 DIASTOLIC ICA/CCA RATIO:  2.3 ECA:  127 cm/sec LEFT ICA:  107 cm/sec CCA:  91 cm/sec SYSTOLIC ICA/CCA RATIO:  1.2 DIASTOLIC ICA/CCA RATIO:  2.7  ECA:  91 cm/sec RIGHT CAROTID ARTERY: Little if any plaque in the bulb. Low resistance internal carotid Doppler pattern. RIGHT VERTEBRAL ARTERY:  Antegrade. LEFT CAROTID ARTERY: Little if any plaque in the bulb. Low resistance internal carotid Doppler pattern. LEFT VERTEBRAL ARTERY:  Antegrade. IMPRESSION: Less than 50% stenosis in the right and left internal carotid arteries. Electronically Signed   By: Marybelle Killings M.D.   On: 10/14/2016 15:48    EKG:   Orders placed or performed during the hospital encounter of 10/14/16  . ED EKG  . ED EKG  . EKG 12-Lead  . EKG 12-Lead  . ED EKG  . ED EKG  . EKG 12-Lead  . EKG 12-Lead  . EKG 12-Lead  . EKG 12-Lead      Management plans discussed with the patient, family and they are in agreement.  CODE STATUS:     Code Status Orders        Start     Ordered   10/14/16 1557  Full code  Continuous     10/14/16 1556    Code Status History    Date Active Date Inactive Code Status Order ID Comments User Context   This patient has a current code status but no historical code status.    Advance Directive Documentation     Most Recent Value  Type of Advance Directive  Healthcare Power of Attorney, Living will  Pre-existing out of facility DNR order (yellow form or pink MOST form)  -  "MOST" Form in Place?  -      TOTAL TIME TAKING CARE OF THIS PATIENT: 35 minutes.    Vaughan Basta M.D on 10/16/2016 at 2:48 PM  Between 7am to 6pm - Pager - [phone]  After 6pm go to [email or web address] - password [email or web address] [facility] [place] [facility]  Office  [phone]  CC: Primary care physician; Hortencia Pilar, MD   Note: This dictation was prepared with Dragon dictation along with smaller phrase technology. Any transcriptional errors that result from this process are unintentional.

## 2016-10-16 NOTE — Care Management (Signed)
Physical therapy recommended home with home health but patient respectfully declines.  Notified attending. No other discharge needs identified by members of the care team.

## 2016-10-16 NOTE — Care Management Important Message (Signed)
Important Message  Patient Details  Name: Mallory Walters MRN: [id number] Date of Birth: [birth year]   Medicare Important Message Given:  Yes    Katrina Stack, RN 10/16/2016, 6:14 PM

## 2016-10-17 LAB — URINE CULTURE

## 2017-03-02 ENCOUNTER — Encounter: Payer: Self-pay | Admitting: *Deleted

## 2017-03-02 ENCOUNTER — Ambulatory Visit
Admission: EM | Admit: 2017-03-02 | Discharge: 2017-03-02 | Disposition: A | Discharge status: Home / Self Care | Payer: Medicare Other | Attending: Family Medicine | Admitting: Family Medicine

## 2017-03-02 ENCOUNTER — Other Ambulatory Visit: Payer: Self-pay

## 2017-03-02 DIAGNOSIS — J0100 Acute maxillary sinusitis, unspecified: Secondary | ICD-10-CM | POA: Diagnosis not present

## 2017-03-02 MED ORDER — DOXYCYCLINE HYCLATE 100 MG PO TABS: 100.0000 mg | ORAL_TABLET | Freq: Two times a day (BID) | ORAL | 0 refills | Status: AC

## 2017-03-02 NOTE — ED Provider Notes (Signed)
[facility]    CSN: [id number] Arrival date & time: 03/02/17  1520     History   Chief Complaint Chief Complaint  Patient presents with  . Nasal Congestion  . Cough    HPI Mallory Walters is a 76 y.o. female.   The history is provided by the patient.  URI  Presenting symptoms: congestion and facial pain   Severity:  Moderate Onset quality:  Sudden Duration:  4 days Timing:  Constant Progression:  Worsening Relieved by:  Nothing Ineffective treatments:  OTC medications Associated symptoms: sinus pain   Risk factors: being elderly and diabetes mellitus     Past Medical History:  Diagnosis Date  . Arthritis   . Cancer ([place])   . Diabetes mellitus without complication ([place])   . Hyperlipidemia   . Hypertension     Patient Active Problem List   Diagnosis Date Noted  . Near syncope 10/14/2016  . Accelerated hypertension 10/14/2016  . CA skin, basal cell 04/30/2014  . Diabetes mellitus, type 2 ([place]) 04/30/2014  . Colon, diverticulosis 04/30/2014  . HLD (hyperlipidemia) 04/30/2014  . BP (high blood pressure) 04/30/2014  . Bursitis, trochanteric 07/04/2013  . H/O total hip arthroplasty 06/16/2013  . Adult BMI 30+ 05/19/2013  . Primary localized osteoarthrosis of pelvic region 03/20/2013  . Arthralgia of hip 10/10/2011  . Plantar fasciitis 10/10/2011    Past Surgical History:  Procedure Laterality Date  . TOTAL HIP ARTHROPLASTY      OB History    No data available       Home Medications    Prior to Admission medications   Medication Sig Start Date End Date Taking? Authorizing Provider  aspirin 81 MG tablet Take 81 mg by mouth daily.   Yes [provider]  latanoprost (XALATAN) 0.005 % ophthalmic solution Place 1 drop into the left eye at bedtime. 08/23/16  Yes [provider]  lisinopril-hydrochlorothiazide (PRINZIDE,ZESTORETIC) 10-12.5 MG tablet Take 1 tablet by mouth daily. 08/23/16  Yes [provider]    metFORMIN (GLUCOPHAGE-XR) 500 MG 24 hr tablet Take 500 mg by mouth daily.  04/13/14  Yes [provider]  simvastatin (ZOCOR) 20 MG tablet Take 20 mg by mouth daily.  03/13/14  Yes [provider]  doxycycline (VIBRA-TABS) 100 MG tablet Take 1 tablet (100 mg total) by mouth 2 (two) times daily. 03/02/17   Norval Gable, MD  metoprolol tartrate (LOPRESSOR) 25 MG tablet Take 0.5 tablets (12.5 mg total) by mouth 2 (two) times daily. 10/16/16   Vaughan Basta, MD    Family History History reviewed. No pertinent family history.  Social History Social History   Tobacco Use  . Smoking status: Former Research scientist (life sciences)  . Smokeless tobacco: Never Used  Substance Use Topics  . Alcohol use: No    Alcohol/week: 0.0 oz  . Drug use: No     Allergies   Codeine; Iodinated diagnostic agents; Iopamidol; Tramadol; and Penicillin g   Review of Systems Review of Systems  HENT: Positive for congestion and sinus pain.      Physical Exam Triage Vital Signs ED Triage Vitals  Enc Vitals Group     BP 03/02/17 1602 (!) 153/84     Pulse Rate 03/02/17 1602 80     Resp 03/02/17 1602 16     Temp 03/02/17 1602 97.9 F (36.6 C)     Temp Source 03/02/17 1602 Oral     SpO2 03/02/17 1602 100 %     Weight 03/02/17 1603  172 lb (78 kg)     Height 03/02/17 1603 4\' 11"  (1.499 m)     Head Circumference --      Peak Flow --      Pain Score 03/02/17 1604 5     Pain Loc --      Pain Edu? --      Excl. in [place]? --    No data found.  Updated Vital Signs BP (!) 153/84 (BP Location: Left Arm)   Pulse 80   Temp 97.9 F (36.6 C) (Oral)   Resp 16   Ht 4\' 11"  (1.499 m)   Wt 172 lb (78 kg)   SpO2 100%   BMI 34.74 kg/m   Visual Acuity Right Eye Distance:   Left Eye Distance:   Bilateral Distance:    Right Eye Near:   Left Eye Near:    Bilateral Near:     Physical Exam  Constitutional: She appears well-developed and well-nourished. No distress.  HENT:  Head: Normocephalic and  atraumatic.  Right Ear: Tympanic membrane, external ear and ear canal normal.  Left Ear: Tympanic membrane, external ear and ear canal normal.  Nose: Mucosal edema and rhinorrhea present. No nose lacerations, sinus tenderness, nasal deformity, septal deviation or nasal septal hematoma. No epistaxis.  No foreign bodies. Right sinus exhibits maxillary sinus tenderness and frontal sinus tenderness. Left sinus exhibits maxillary sinus tenderness and frontal sinus tenderness.  Mouth/Throat: Uvula is midline, oropharynx is clear and moist and mucous membranes are normal. No oropharyngeal exudate.  Eyes: Conjunctivae and EOM are normal. Pupils are equal, round, and reactive to light. Right eye exhibits no discharge. Left eye exhibits no discharge. No scleral icterus.  Neck: Normal range of motion. Neck supple. No thyromegaly present.  Cardiovascular: Normal rate, regular rhythm and normal heart sounds.  Pulmonary/Chest: Effort normal and breath sounds normal. No respiratory distress. She has no wheezes. She has no rales.  Lymphadenopathy:    She has no cervical adenopathy.  Skin: She is not diaphoretic.  Nursing note and vitals reviewed.    UC Treatments / Results  Labs (all labs ordered are listed, but only abnormal results are displayed) Labs Reviewed - No data to display  EKG  EKG Interpretation None       Radiology No results found.  Procedures Procedures (including critical care time)  Medications Ordered in UC Medications - No data to display   Initial Impression / Assessment and Plan / UC Course  I have reviewed the triage vital signs and the nursing notes.  Pertinent labs & imaging results that were available during my care of the patient were reviewed by me and considered in my medical decision making (see chart for details).       Final Clinical Impressions(s) / UC Diagnoses   Final diagnoses:  Acute maxillary sinusitis, recurrence not specified    ED Discharge  Orders        Ordered    doxycycline (VIBRA-TABS) 100 MG tablet  2 times daily     03/02/17 1701     1. diagnosis reviewed with patient 2. rx as per orders above; reviewed possible side effects, interactions, risks and benefits  3. Recommend supportive treatment with otc flonase  4. Follow-up prn if symptoms worsen or don't improve  Controlled Substance Prescriptions [place] Controlled Substance Registry consulted? Not Applicable   Norval Gable, MD 03/02/17 [phone]

## 2017-03-02 NOTE — ED Triage Notes (Signed)
Patient started having symptoms of nasal congestion and cough 2 days ago.

## 2017-05-15 ENCOUNTER — Encounter: Payer: Self-pay | Admitting: Podiatry

## 2017-05-15 ENCOUNTER — Ambulatory Visit (INDEPENDENT_AMBULATORY_CARE_PROVIDER_SITE_OTHER): Payer: Medicare Other | Admitting: Podiatry

## 2017-05-15 DIAGNOSIS — Q828 Other specified congenital malformations of skin: Secondary | ICD-10-CM

## 2017-05-16 NOTE — Progress Notes (Signed)
   Subjective: 77 year old female presenting today with a chief complaint of burning, stinging pain to the fourth left toe secondary to a callus lesion that has been present for the past 2-3 months. She states the area rubs against the 5th toe and has worsened over the past month. Wearing shoes increases the pain. She has not done anything for treatment. Patient is here for further evaluation and treatment.   Past Medical History:  Diagnosis Date  . Arthritis   . Cancer ([place])   . Diabetes mellitus without complication ([place])   . Hyperlipidemia   . Hypertension      Objective:  Physical Exam General: Alert and oriented x3 in no acute distress  Dermatology: Hyperkeratotic lesion present on the 4th and 5th toes of the left foot. Pain on palpation with a central nucleated core noted.  Skin is warm, dry and supple bilateral lower extremities. Negative for open lesions or macerations.  Vascular: Palpable pedal pulses bilaterally. No edema or erythema noted. Capillary refill within normal limits.  Neurological: Epicritic and protective threshold grossly intact bilaterally.   Musculoskeletal Exam: Pain on palpation at the keratotic lesion noted. Range of motion within normal limits bilateral. Muscle strength 5/5 in all groups bilateral.  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  #3 Dressed area with light dressing. #4 Silicone toe spacers dispensed.  #5 Patient is to return to the clinic PRN.   Edrick Kins, DPM [facility]  Dr. Edrick Kins, [address]                                        [place], [place] [address]                Office [phone]  Fax [phone]

## 2018-06-03 IMAGING — CT CT HEAD W/O CM
3 series · 15 of 47 positions shown, 18 images · non-contrast
Comparison: None

CLINICAL DATA: Became dizzy while shopping this morning, fell [place]
knees but did not strike her head, history hypertension, diabetes
mellitus

EXAM:
CT HEAD WITHOUT CONTRAST
TECHNIQUE: Contiguous axial images were obtained from the base of the skull
through the vertex without intravenous contrast.

[Series 2: head wo · axial · 0.47mm/px · z∈[-92,+38]mm · 9 of 32 slices shown, 12 images]
[im 3/32  brain]
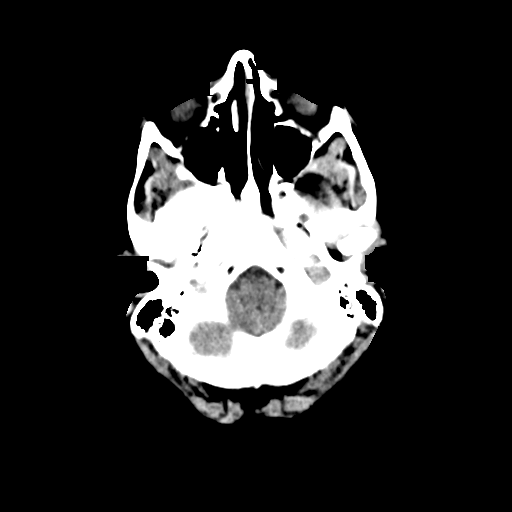
[im 3/32  bone]
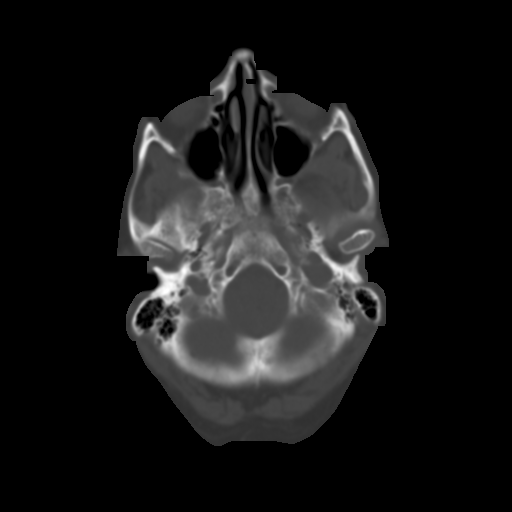
[im 6/32  brain]
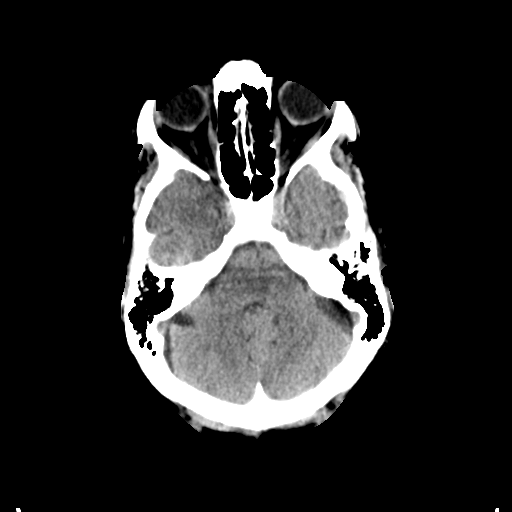
[im 9/32  brain]
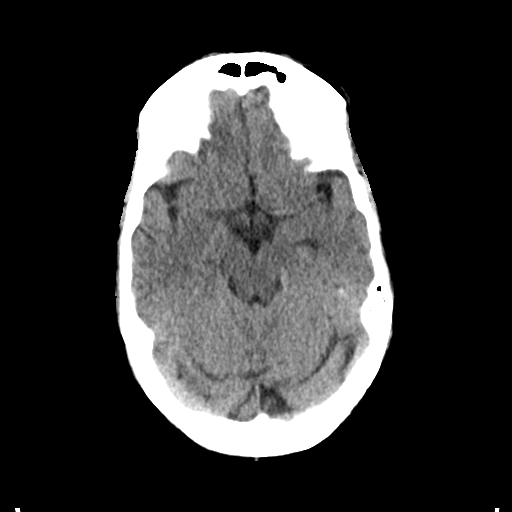
[im 12/32  brain]
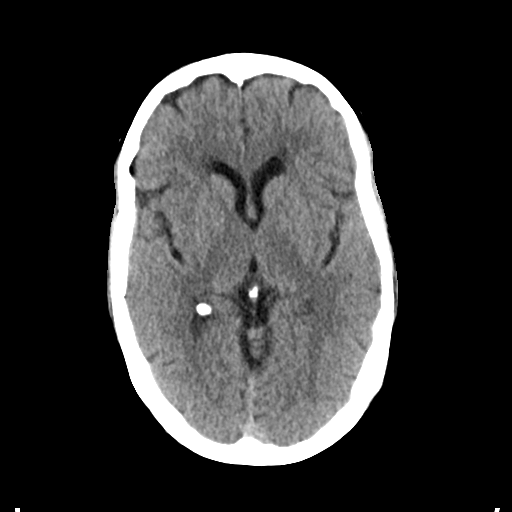
[im 17/32  brain]
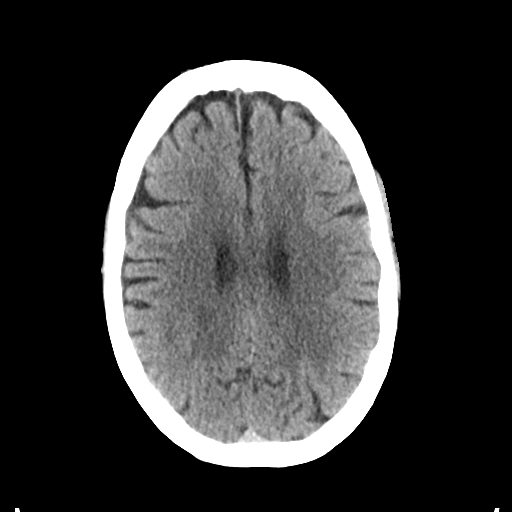
[im 17/32  bone]
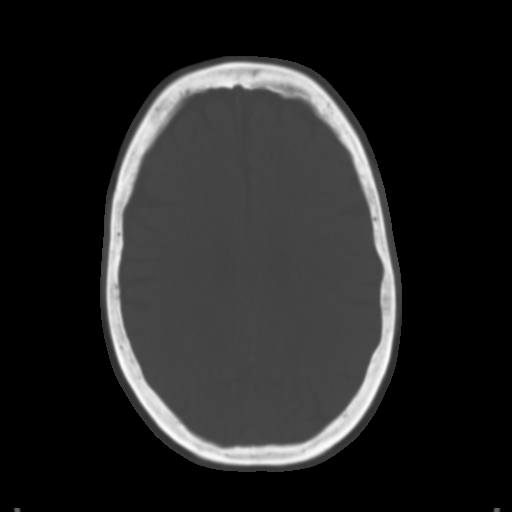
[im 20/32  brain]
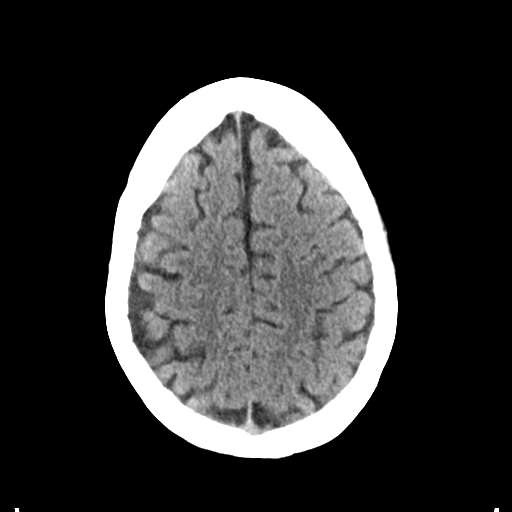
[im 23/32  brain]
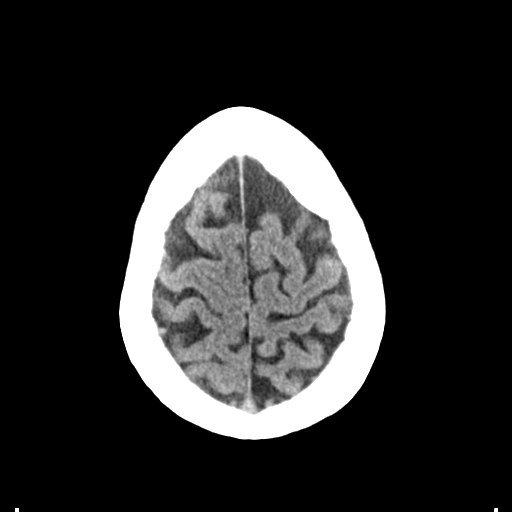
[im 26/32  brain]
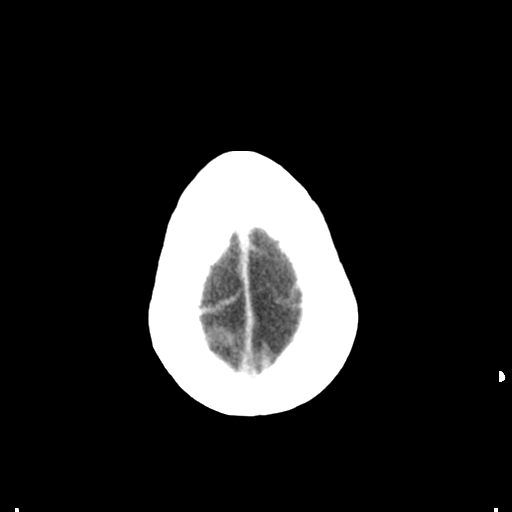
[im 29/32  brain]
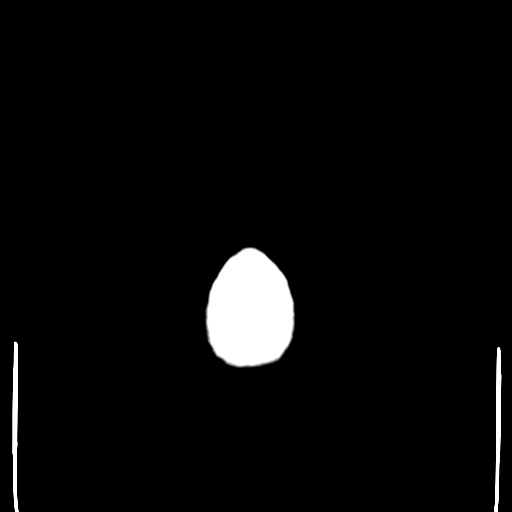
[im 29/32  bone]
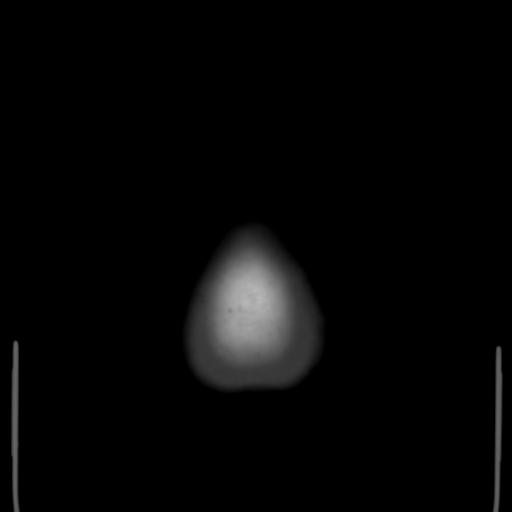

[Series 4: coronal soft tissue · coronal · 0.30mm/px · 3 of 71 slices shown]
[im 24/71  brain]
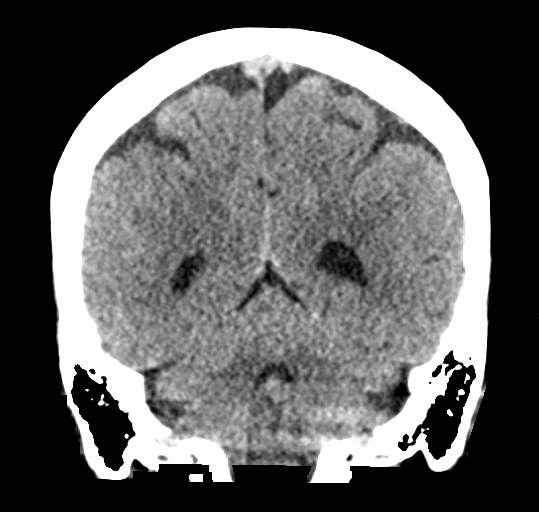
[im 32/71  brain]
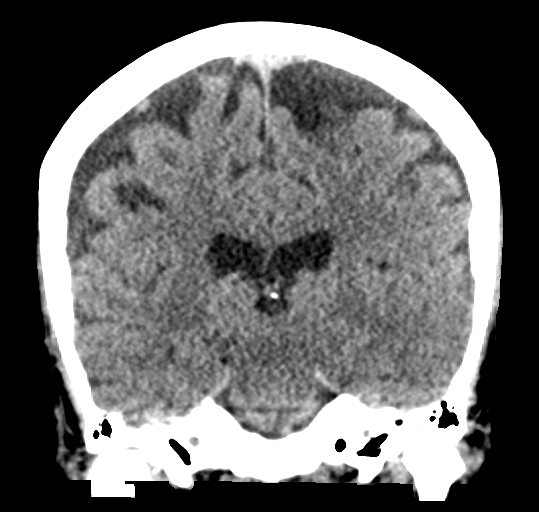
[im 39/71  brain]
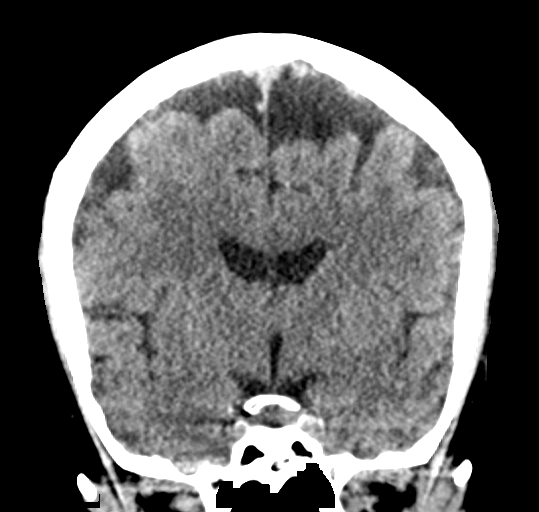

[Series 5: sagittal soft tissue · sagittal · 0.29mm/px · 3 of 51 slices shown]
[im 17/51  brain]
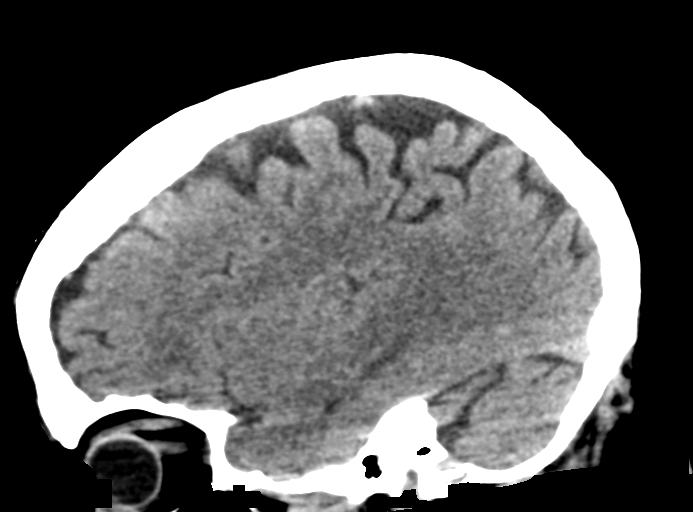
[im 26/51  brain]
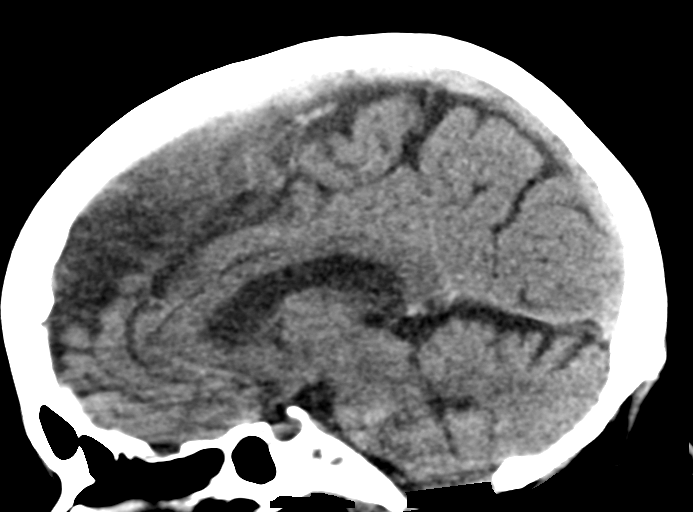
[im 34/51  brain]
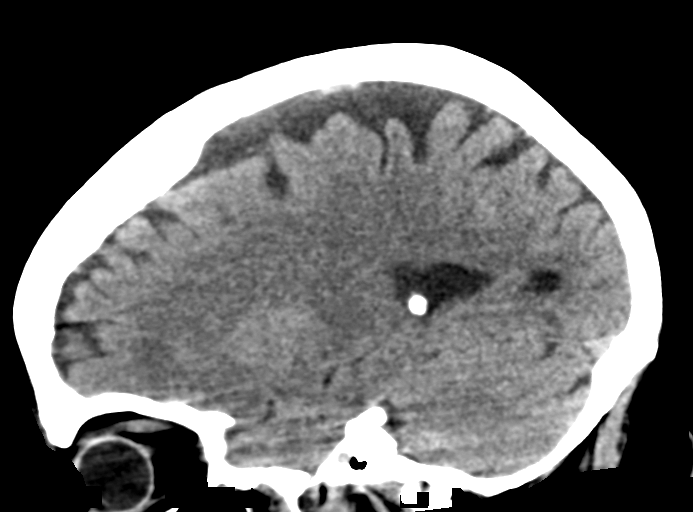

[15 of 47 positions shown; findings below may reference images not displayed]

FINDINGS: Brain: Normal ventricular morphology. No midline shift or mass
effect. Question small old lacunar infarct versus prominent
perivascular space at RIGHT basal ganglia. No intracranial
hemorrhage, mass lesion, or evidence of cortical infarction. No
extra-axial fluid collections.

Vascular: Unremarkable

Skull: Intact

Sinuses/Orbits: Clear

Other: N/A
IMPRESSION: Question small old lacunar infarct versus prominent perivascular
space at RIGHT basal ganglia.

No definite acute intracranial abnormalities.

## 2019-06-20 ENCOUNTER — Other Ambulatory Visit: Payer: Self-pay

## 2019-06-20 ENCOUNTER — Ambulatory Visit: Payer: Medicare Other | Attending: Internal Medicine

## 2019-06-20 DIAGNOSIS — Z23 Encounter for immunization: Secondary | ICD-10-CM

## 2019-06-20 NOTE — Progress Notes (Signed)
   Covid-19 Vaccination Clinic  Name:  Mallory Walters    MRN: [id number] DOB: 05-03-1940  06/20/2019  [name] was observed post Covid-19 immunization for 30 minutes based on pre-vaccination screening without incident. She was provided with Vaccine Information Sheet and instruction to access the V-Safe system.   [name] was instructed to call 911 with any severe reactions post vaccine: Marland Kitchen Difficulty breathing  . Swelling of face and throat  . A fast heartbeat  . A bad rash all over body  . Dizziness and weakness   Immunizations Administered    Name Date Dose VIS Date Route   Pfizer COVID-19 Vaccine 06/20/2019  9:01 AM 0.3 mL 02/14/2019 Intramuscular   Manufacturer: [employer], [employer]   Lot: [id number]   [place]: [id number]

## 2019-07-15 ENCOUNTER — Ambulatory Visit: Payer: Medicare Other | Attending: Internal Medicine

## 2019-07-15 DIAGNOSIS — Z23 Encounter for immunization: Secondary | ICD-10-CM

## 2019-07-15 NOTE — Progress Notes (Signed)
   Covid-19 Vaccination Clinic  Name:  Mallory Walters    MRN: [id number] DOB: 06-04-1940  07/15/2019  [name] was observed post Covid-19 immunization for 15 minutes without incident. She was provided with Vaccine Information Sheet and instruction to access the V-Safe system.   [name] was instructed to call 911 with any severe reactions post vaccine: Marland Kitchen Difficulty breathing  . Swelling of face and throat  . A fast heartbeat  . A bad rash all over body  . Dizziness and weakness   Immunizations Administered    Name Date Dose VIS Date Route   Pfizer COVID-19 Vaccine 07/15/2019  8:56 AM 0.3 mL 04/30/2018 Intramuscular   Manufacturer: [place]   Lot: [id number]   [place]: [id number]
